# Patient Record
Sex: Female | Born: 1980 | Race: Black or African American | Hispanic: No | Marital: Single | Smoking: Never smoker
Health system: Southern US, Community
[De-identification: ages and names within clinical notes are randomized; demographics above are authoritative.]

## PROBLEM LIST (undated history)

## (undated) DIAGNOSIS — I10 Essential (primary) hypertension: Secondary | ICD-10-CM

## (undated) DIAGNOSIS — E785 Hyperlipidemia, unspecified: Secondary | ICD-10-CM

## (undated) HISTORY — DX: Essential (primary) hypertension: I10

## (undated) HISTORY — DX: Hyperlipidemia, unspecified: E78.5

---

## 1998-09-25 ENCOUNTER — Emergency Department (HOSPITAL_COMMUNITY): Admission: EM | Admit: 1998-09-25 | Discharge: 1998-09-25 | Payer: Self-pay | Admitting: Emergency Medicine

## 1999-12-29 ENCOUNTER — Emergency Department (HOSPITAL_COMMUNITY): Admission: EM | Admit: 1999-12-29 | Discharge: 1999-12-29 | Payer: Self-pay | Admitting: Emergency Medicine

## 2000-06-09 ENCOUNTER — Encounter: Payer: Self-pay | Admitting: Obstetrics and Gynecology

## 2000-06-09 ENCOUNTER — Ambulatory Visit (HOSPITAL_COMMUNITY): Admission: RE | Admit: 2000-06-09 | Discharge: 2000-06-09 | Payer: Self-pay | Admitting: Obstetrics and Gynecology

## 2000-08-15 ENCOUNTER — Inpatient Hospital Stay (HOSPITAL_COMMUNITY): Admission: AD | Admit: 2000-08-15 | Discharge: 2000-08-17 | Payer: Self-pay | Admitting: Obstetrics and Gynecology

## 2000-08-15 ENCOUNTER — Encounter (INDEPENDENT_AMBULATORY_CARE_PROVIDER_SITE_OTHER): Payer: Self-pay

## 2000-08-15 ENCOUNTER — Encounter (HOSPITAL_COMMUNITY): Admission: RE | Admit: 2000-08-15 | Discharge: 2000-08-16 | Payer: Self-pay | Admitting: Obstetrics and Gynecology

## 2001-01-11 ENCOUNTER — Emergency Department (HOSPITAL_COMMUNITY): Admission: EM | Admit: 2001-01-11 | Discharge: 2001-01-12 | Payer: Self-pay | Admitting: Emergency Medicine

## 2002-05-28 ENCOUNTER — Inpatient Hospital Stay (HOSPITAL_COMMUNITY): Admission: AD | Admit: 2002-05-28 | Discharge: 2002-05-30 | Payer: Self-pay | Admitting: Obstetrics and Gynecology

## 2002-05-28 ENCOUNTER — Encounter: Payer: Self-pay | Admitting: Obstetrics and Gynecology

## 2002-05-28 ENCOUNTER — Encounter (INDEPENDENT_AMBULATORY_CARE_PROVIDER_SITE_OTHER): Payer: Self-pay

## 2004-06-18 ENCOUNTER — Other Ambulatory Visit: Admission: RE | Admit: 2004-06-18 | Discharge: 2004-06-18 | Payer: Self-pay | Admitting: Obstetrics and Gynecology

## 2004-07-26 ENCOUNTER — Ambulatory Visit: Payer: Self-pay | Admitting: Obstetrics & Gynecology

## 2004-08-03 ENCOUNTER — Inpatient Hospital Stay (HOSPITAL_COMMUNITY): Admission: AD | Admit: 2004-08-03 | Discharge: 2004-08-03 | Payer: Self-pay | Admitting: Obstetrics and Gynecology

## 2004-08-03 ENCOUNTER — Ambulatory Visit: Payer: Self-pay | Admitting: *Deleted

## 2004-08-06 ENCOUNTER — Inpatient Hospital Stay (HOSPITAL_COMMUNITY): Admission: RE | Admit: 2004-08-06 | Discharge: 2004-08-11 | Payer: Self-pay | Admitting: Obstetrics and Gynecology

## 2004-08-07 ENCOUNTER — Encounter (INDEPENDENT_AMBULATORY_CARE_PROVIDER_SITE_OTHER): Payer: Self-pay | Admitting: Specialist

## 2007-04-15 ENCOUNTER — Emergency Department (HOSPITAL_COMMUNITY): Admission: EM | Admit: 2007-04-15 | Discharge: 2007-04-15 | Payer: Self-pay | Admitting: Emergency Medicine

## 2010-08-13 NOTE — Op Note (Signed)
Jamie Livingston, Jamie Livingston            ACCOUNT NO.:  [id number]   MEDICAL RECORD NO.:  [id number]          PATIENT TYPE:  INP   LOCATION:  9103                          F[facility]ILITY:  [facility]   PHYSICIAN:  Charles A. Delcambre, MDDATE OF BIRTH:  07/31/1980   DATE OF PROCEDURE:  08/07/2004  DATE OF DISCHARGE:                                 OPERATIVE REPORT   PREOPERATIVE DIAGNOSES:  1.  Intrauterine pregnancy at 37 weeks 2 days.  2.  Twin pregnancy.  3.  Intrauterine growth restriction, Baby B.  4.  Anti-E antibody.  5.  Transverse/transverse lie.  6.  Undesired fertility.  7.  Multiparity.   POSTOPERATIVE DIAGNOSES:  1.  Intrauterine pregnancy at 37 weeks 2 days.  2.  Twin pregnancy.  3.  Intrauterine growth restriction, Baby B.  4.  Anti-E antibody.  5.  Transverse/transverse lie.  6.  Undesired fertility.  7.  Multiparity.   PROCEDURES:  1.  Primary low transverse cesarean section.  2.  Bilateral tubal ligation, modified Pomeroy type.   SURGEON:  Dr. Sydnee Cabal   ASSISTANT:  Dr. Okey Dupre   ANESTHESIA:  Spinal.   SPECIMENS:  Will send to pathology.   OPERATIVE FINDINGS:  Baby A, vigorous female, cord arterial blood gas 7.27,  venous blood gas 7.36, Apgars 8 and 9, weight 5 pounds 9 ounces.  Baby B,  vigorous female, arterial 7.25, venous 7.31, Apgars 8 and 9, weight 4 pounds  10 ounces.   ESTIMATED BLOOD LOSS:  1250 mL.   COMPLICATIONS:  None.   URINE OUTPUT:  125 mL.   IV FLUIDS:  3000 lactated Ringer's.   INSTRUMENT, SPONGE AND NEEDLE COUNT:  Correct x 2.   DESCRIPTION OF PROCEDURE:  The patient was taken to the operating room and  placed in supine position and after spinal was dosed and was adequate, a  sterile prep and drape was undertaken.  Pfannenstiel incision was made with  a knife, carried down to fascia.  Fascia was incised with the knife and Mayo  scissors.  Rectus sheath was released superiorly and inferiorly.  Rectus  muscles were sharply dissected in the  midline; peritoneum was entered with  the Metzenbaum scissors.  Bladder blade was placed.  Lower uterine segment  was isolated, and the vesicouterine peritoneum was incised with the  Metzenbaum scissors.  Blunt dissection was used to develop the bladder flap,  and the bladder blade was placed.  Lower uterine segment transverse incision  to amniotomy was done.  Traction was used to extend the incision; hand was  inserted.  Back down transverse lie was noted.  Reaching in feet were  grasped carefully, and infant was manipulated down Baby A to a breech lie,  and feet were extracted. Breech extraction was then accomplished without  difficulty.  Arms were sweat-free and head delivered with mild fundal  pressure.  Cord was clamped, and infant was handed off to neonatology  personnel after being shown to the mother.  Cord was marked for Baby A with  a cord clamp.  Fundal pressure was then applied to move Baby B down, and  hand was  inserted to guide the baby down.  Attempt was made to grasp the  feet, in that the baby was noted to be back down transverse once again and  in trying to grasp the feet, the baby turned to a vertex lie with the feet  up in the fundus, precluding breech extraction.  Fundal pressure continued  in an effort to try to move the baby down in a vertex position.  Gentle but  delivered pressure was applied while guiding the head down with the internal  hand.  Continued fundal pressure was used but could not get the occiput to  enter completely into the uterine incision.  Judgment at this time after  several minutes or pressure was made to use the Mity-vac soft vacuum, and  this was applied in a single vacuum with no popoffs.  With one fundal  pressure application, very easily, the baby delivered within a few seconds  of application of the vacuum, a very easy delivery with guidance with the  vacuum.  Vacuum was released and the infant delivered without difficulty.  Cord was cut  and marked with two Kelly clamps for Baby B.  Infant was shown  to the family and taken to neonatologist in attendance.  Manual expression  of the placenta was then undertaken after cord gases were obtained and cord  blood was obtained.  Internal surface of the uterus was wiped with a  moistened lap.  Then #1 chromic running locking suture was then placed on  the uterus to close the incision.  Interrupted figure-of-eight suture of #1  chromic were used to close the second layer, imbricating over the first and  to achieve hemostasis.  Two 2-0 Vicryl figure-of-eight sutures were used to  achieve hemostasis, and hemostasis was excellent.  Irrigation was carried  out.  Bladder flap hemostasis and uterine hemostasis was excellent.  Attention was then turned to the tubal ligation.  Middle segment of the tube  on either side was doubly ligated with 0 plain gut.  Segment of tube was  excised.  Lumen were clearly transected and pedicles were with good  hemostasis.  Segments were sent separately left and right to pathology.  Hemostasis was verified after further irrigation of the pericolic gutters.  Subfascial hemostasis was excellent.  Fascia was closed with #1 Vicryl  running nonlocking suture.  Subcutaneous hemostasis was irrigated.  Minor  electrocautery was used to achieve hemostasis completely and skin staples  used to close the skin.  Sterile dressing was applied.  The patient was  taken to recovery with physician in attendance, having tolerated the  procedure well.      CAD/MEDQ  D:  08/07/2004  T:  08/08/2004  Job:  [id number]

## 2010-08-13 NOTE — Discharge Summary (Signed)
NAMESAXON, Jamie Livingston            ACCOUNT NO.:  [id number]   MEDICAL RECORD NO.:  [id number]          PATIENT TYPE:  WOC   LOCATION:  WOC                          FACILITY:  [facility]   PHYSICIAN:  Jamie Carne, Jamie Livingston  DATE OF BIRTH:  27-Apr-1980   DATE OF ADMISSION:  DATE OF DISCHARGE:                                 DISCHARGE SUMMARY   REASON FOR HOSPITALIZATION:  A [redacted] weeks gestation twin pregnancy with  intrauterine growth retardation of baby B and anti E affected pregnancy.   IN-HOSPITAL PROCEDURES:  Primary low transverse cesarean section for twin  gestation and Pomeroy bilateral tubal ligation performed by Leonette Most A.  Sydnee Cabal, M.D.   FINAL DIAGNOSIS:  Preterm viable delivery of twin gestation.   HOSPITAL COURSE:  This patient is a 29 year old African-American female, [facility]  September 15, 2004 at [redacted] weeks gestation with known anti E antibody, who  underwent a primary low transverse cesarean section on May 13,2006.  Baby B  demonstrated intrauterine growth retardation on ultrasound.  Both fetuses  were in a transverse lie prior to delivery.  Apgar and weights for both  fetuses per NICU records.   The patient's postoperative course was uneventful.  She was afebrile, voided  well.  Postoperative hemoglobin was 9.2 and hematocrit 26.7.  The patient's  incision was dry.  Abdomen soft.  Calves without tenderness.  Lungs were  clear.  Fundus was firm with minimal flow.   The patient was discharged with routine postpartum postoperative  instructions including contacting the office for temperature elevation above  100.4 degrees Fahrenheit, incisional drainage, erythema or increasing  abdominal or incisional pain.  Increasing vaginal bleeding, and/or urinary  tract symptomatology.  The patient was advised to return to Dr. Ashley Royalty  office on Aug 13, 2004 to have her staples removed.  Percocet 5/325 one to  two every 4-6 hours as needed was prescribed for the patient including  ibuprofen  800 milligrams up to three times a day.  The patient understood  all instructions and all questions answered to the satisfaction of said  patient.  Both babies are in the neonatal intensive care unit the time of  this dictation.      SHB/MEDQ  D:  08/11/2004  T:  08/11/2004  Job:  [id number]

## 2010-08-13 NOTE — Discharge Summary (Signed)
[facility]  Patient:    Jamie Livingston, Jamie Livingston                     MRN: [id number] Adm. Date:  08/15/00 Disc. Date: 08/17/00 Attending:  Malachi Pro. Ambrose Mantle, M.D.                           Discharge Summary                                This is a 30 year old black single female para 1-0-1-1, gravida 3 with last period questionably in September 2001, [facility] Aug 13, 2000 by ultrasound done June 09, 2000 admitted for induction after having variable decelerations on a nonstress test and a positive OCT.  Blood group and type A+.  Negative antibody.  Sickle cell negative.  VDRL negative. Rubella immune.  Hepatitis B surface antigen negative.  HIV negative.  GC and chlamydia negative.  One hour glucola 91.  Group B strep negative.  The patients first prenatal visit June 16, 2000:  Ultrasound June 09, 2000 showed an average gestational age of [redacted] weeks and 5 days with an [facility] of Aug 13, 2000.  She was treated with Macrobid for a UTI.  The patient had been scheduled for a nonstress test on Aug 11, 2000, but failed to keep the appointment.  She did come for a nonstress test on Aug 15, 2000 that showed two spontaneous variable decelerations and OCT showed two consecutive late decelerations so she was admitted for induction.  ALLERGIES:                    BENADRYL causes hives.  PAST SURGICAL HISTORY:        None.  PAST MEDICAL HISTORY:         Usual childhood diseases.  SOCIAL HISTORY:               Alcohol, tobacco, and drugs:  None.  FAMILY HISTORY:               No significant family history in first degree relatives.  PAST OBSTETRICAL HISTORY:     In February 1999 the patient delivered a 7 pounds 6 ounces female at 38 weeks.  It was a vaginal delivery without complications.  PHYSICAL EXAMINATION  VITAL SIGNS:                  Normal.  ABDOMEN:                      Soft and nontender.  Fundal height had been 39 cm on Aug 14, 2000.  Fetal heart tones  showed recurrent variable decelerations with good recovery and good variability.  PELVIC:                       Cervix was 2 cm long, vertex, -3 by the maternity admission unit nurse.  IMPRESSION:                   Intrauterine pregnancy at 40 weeks, positive OCT.  Patient was admitted for Pitocin.  She did respond to Pitocin and progressed to 4 cm.  She received an epidural.  She had spontaneous rupture of membranes with meconium stained fluid.  She then progressed to full dilatation and delivered spontaneously LOA over an intact perineum  by Dr. Ambrose Mantle a living female infant 6 pounds 5 ounces, Apgars of 8 at one and 9 at five minutes. Because of meconium stained fluid, the neonatal team was in attendance with Dr. Mikle Bosworth present.  There was meconium stained fluid and the DeLee and the bulb were used on the perineum.  Dr. Mikle Bosworth attended the baby and assigned the Apgars.  Placenta was intact.  The uterus was normal.  A 4 mm yellow cyst was removed from the left lateral vagina near the introitus and was sutured with 3-0 Dexon.  Blood loss was about 400 cc.  Postpartum the patient did quite well and was discharged on the second postpartum day.  Her initial hemoglobin was 12.7, hematocrit 37.7, white count 11,200, platelet count 235,000. Followup hematocrit was 35.7.  RPR was nonreactive.  FINAL DIAGNOSES:              Intrauterine pregnancy at term delivered LOA. Variable decelerations on nonstress test.  Positive OCT.  Left vaginal cyst.  OPERATION:                    Spontaneous delivery LOA, removal of vaginal cyst, Pitocin induction of labor.  FINAL CONDITION:              Improved.  INSTRUCTIONS:                 Regular discharge instruction booklet.  Patient did get a prescription for ibuprofen 600 mg 20 tablets one q.6h. p.r.n. pain. She is advised to return to the office in six weeks for followup examination. DD:  08/17/00 TD:  08/17/00 Job: [id number]

## 2018-08-31 ENCOUNTER — Ambulatory Visit (HOSPITAL_COMMUNITY)
Admission: EM | Admit: 2018-08-31 | Discharge: 2018-08-31 | Disposition: A | Discharge status: Home / Self Care | Payer: PRIVATE HEALTH INSURANCE | Attending: Family Medicine | Admitting: Family Medicine

## 2018-08-31 ENCOUNTER — Encounter (HOSPITAL_COMMUNITY): Payer: Self-pay | Admitting: Emergency Medicine

## 2018-08-31 ENCOUNTER — Other Ambulatory Visit: Payer: Self-pay

## 2018-08-31 DIAGNOSIS — R030 Elevated blood-pressure reading, without diagnosis of hypertension: Secondary | ICD-10-CM

## 2018-08-31 DIAGNOSIS — H6981 Other specified disorders of Eustachian tube, right ear: Secondary | ICD-10-CM

## 2018-08-31 DIAGNOSIS — H6991 Unspecified Eustachian tube disorder, right ear: Secondary | ICD-10-CM

## 2018-08-31 MED ORDER — LORATADINE 10 MG PO TABS: 10.0000 mg | ORAL_TABLET | Freq: Every day | ORAL | 0 refills | Status: AC

## 2018-08-31 MED ORDER — FLUTICASONE PROPIONATE 50 MCG/ACT NA SUSP: 2.0000 | Freq: Every day | NASAL | 2 refills | Status: AC

## 2018-08-31 NOTE — Discharge Instructions (Addendum)
Use nasal spray and allergy medications as directed. Drink plenty of water throughout day to stay hydrated and keep secretion thin. Return if you develop fever, worsening ear/eye/face pain, cough.

## 2018-08-31 NOTE — ED Provider Notes (Signed)
[facility]    CSN: [id number] Arrival date & time: 08/31/18  1553     History   Chief Complaint Chief Complaint  Patient presents with  . Otalgia    HPI Jamie Livingston is a 38 y.o. female with history of morbid obesity presenting for a right ear pain since last week.  Patient endorses fullness, popping, pressure.  Patient denies fever, upper respiratory symptoms, discharge, hearing loss, facial pain.  She has tried over-the-counter "earache drops "with minimal relief of symptoms.  Patient reports history of seasonal allergies, not currently taking any medications.   History reviewed. No pertinent past medical history.  There are no active problems to display for this patient.   History reviewed. No pertinent surgical history.  OB History   No obstetric history on file.      Home Medications    Prior to Admission medications   Medication Sig Start Date End Date Taking? Authorizing Provider  fluticasone (FLONASE) 50 MCG/ACT nasal spray Place 2 sprays into both nostrils daily. 08/31/18   Hall-Potvin, [place], PA-C  loratadine (CLARITIN) 10 MG tablet Take 1 tablet (10 mg total) by mouth daily. 08/31/18   Hall-Potvin, [place], PA-C    Family History History reviewed. No pertinent family history.  Social History Social History   Tobacco Use  . Smoking status: Never Smoker  . Smokeless tobacco: Never Used  Substance Use Topics  . Alcohol use: Never    Frequency: Never  . Drug use: Never     Allergies   Patient has no allergy information on record.   Review of Systems As per HPI   Physical Exam Triage Vital Signs ED Triage Vitals  Enc Vitals Group     BP 08/31/18 1616 (!) 159/100     Pulse Rate 08/31/18 1616 100     Resp 08/31/18 1616 18     Temp 08/31/18 1620 99.1 F (37.3 C)     Temp Source 08/31/18 1616 Oral     SpO2 08/31/18 1616 96 %     Weight --      Height --      Head Circumference --      Peak Flow --      Pain Score  08/31/18 1617 7     Pain Loc --      Pain Edu? --      Excl. in GC? --    No data found.  Updated Vital Signs BP (!) 159/100 (BP Location: Right Arm)   Pulse 100   Temp 99.1 F (37.3 C) (Oral)   Resp 18   SpO2 96%   Visual Acuity Right Eye Distance:   Left Eye Distance:   Bilateral Distance:    Right Eye Near:   Left Eye Near:    Bilateral Near:     Physical Exam Constitutional:      General: She is not in acute distress. HENT:     Head: Normocephalic and atraumatic.     Jaw: There is normal jaw occlusion. No tenderness or pain on movement.     Right Ear: Tympanic membrane, ear canal and external ear normal. No tenderness. There is no impacted cerumen.     Left Ear: Tympanic membrane, ear canal and external ear normal. No tenderness. There is no impacted cerumen.     Ears:     Comments: Clear fluid behind TM with few bubbles.  No blood or purulent discharge behind TM which is intact    Nose: No nasal  deformity, septal deviation, nasal tenderness or rhinorrhea.     Right Sinus: No maxillary sinus tenderness or frontal sinus tenderness.     Left Sinus: No maxillary sinus tenderness or frontal sinus tenderness.     Comments: Bilateral turbinate swelling (right greater than left) mucosal lining is pink    Mouth/Throat:     Lips: Pink. No lesions.     Mouth: Mucous membranes are moist.     Pharynx: Oropharynx is clear. Uvula midline. No oropharyngeal exudate, posterior oropharyngeal erythema or uvula swelling.     Tonsils: No tonsillar exudate. 2+ on the right. 2+ on the left.  Eyes:     General: No scleral icterus.       Right eye: No discharge.        Left eye: No discharge.     Extraocular Movements: Extraocular movements intact.     Conjunctiva/sclera: Conjunctivae normal.     Pupils: Pupils are equal, round, and reactive to light.  Neck:     Musculoskeletal: Normal range of motion and neck supple. No muscular tenderness.  Cardiovascular:     Rate and Rhythm:  Normal rate.  Pulmonary:     Effort: Pulmonary effort is normal.  Lymphadenopathy:     Cervical: No cervical adenopathy.  Neurological:     Mental Status: She is alert and oriented to person, place, and time.      UC Treatments / Results  Labs (all labs ordered are listed, but only abnormal results are displayed) Labs Reviewed - No data to display  EKG None  Radiology No results found.  Procedures Procedures (including critical care time)  Medications Ordered in UC Medications - No data to display  Initial Impression / Assessment and Plan / UC Course  I have reviewed the triage vital signs and the nursing notes.  Pertinent labs & imaging results that were available during my care of the patient were reviewed by me and considered in my medical decision making (see chart for details).     38 year old female history of morbid obesity presenting for right otalgia x1 week.  Exam reassuring, likely eustachian tube dysfunction second to turbinate swelling.  Will treat seasonal allergies, intranasal steroid and monitor as there are no signs of acute infection at this time.  Return precautions discussed, patient verbalized understanding. Final Clinical Impressions(s) / UC Diagnoses   Final diagnoses:  Elevated blood pressure reading in office without diagnosis of hypertension  Acute dysfunction of Eustachian tube, right     Discharge Instructions     Use nasal spray and allergy medications as directed. Drink plenty of water throughout day to stay hydrated and keep secretion thin. Return if you develop fever, worsening ear/eye/face pain, cough.    ED Prescriptions    Medication Sig Dispense Auth. Provider   fluticasone (FLONASE) 50 MCG/ACT nasal spray Place 2 sprays into both nostrils daily. 16 g Hall-Potvin, [place], PA-C   loratadine (CLARITIN) 10 MG tablet Take 1 tablet (10 mg total) by mouth daily. 30 tablet Hall-Potvin, [place], PA-C     Controlled Substance  Prescriptions [place] Controlled Substance Registry consulted? Not Applicable   Shea EvansHall-Potvin, Brittany, [place] JerseyPA-C 08/31/18 1646

## 2019-11-13 ENCOUNTER — Other Ambulatory Visit: Payer: Self-pay | Admitting: Critical Care Medicine

## 2019-11-13 ENCOUNTER — Other Ambulatory Visit: Payer: Self-pay

## 2019-11-13 DIAGNOSIS — Z20822 Contact with and (suspected) exposure to covid-19: Secondary | ICD-10-CM

## 2019-11-15 LAB — NOVEL CORONAVIRUS, NAA: SARS-CoV-2, NAA: NOT DETECTED

## 2019-11-15 LAB — SARS-COV-2, NAA 2 DAY TAT

## 2019-11-20 ENCOUNTER — Other Ambulatory Visit: Payer: PRIVATE HEALTH INSURANCE

## 2020-08-31 ENCOUNTER — Other Ambulatory Visit: Payer: Self-pay

## 2020-08-31 ENCOUNTER — Encounter (HOSPITAL_BASED_OUTPATIENT_CLINIC_OR_DEPARTMENT_OTHER): Payer: Self-pay | Admitting: Emergency Medicine

## 2020-08-31 ENCOUNTER — Emergency Department (HOSPITAL_BASED_OUTPATIENT_CLINIC_OR_DEPARTMENT_OTHER): Payer: BC Managed Care – PPO

## 2020-08-31 ENCOUNTER — Emergency Department (HOSPITAL_BASED_OUTPATIENT_CLINIC_OR_DEPARTMENT_OTHER)
Admission: EM | Admit: 2020-08-31 | Discharge: 2020-08-31 | Disposition: A | Discharge status: Home / Self Care | Payer: BC Managed Care – PPO | Attending: Emergency Medicine | Admitting: Emergency Medicine

## 2020-08-31 ENCOUNTER — Emergency Department (HOSPITAL_BASED_OUTPATIENT_CLINIC_OR_DEPARTMENT_OTHER): Payer: BC Managed Care – PPO | Admitting: Radiology

## 2020-08-31 DIAGNOSIS — W01198A Fall on same level from slipping, tripping and stumbling with subsequent striking against other object, initial encounter: Secondary | ICD-10-CM | POA: Insufficient documentation

## 2020-08-31 DIAGNOSIS — R42 Dizziness and giddiness: Secondary | ICD-10-CM | POA: Diagnosis not present

## 2020-08-31 DIAGNOSIS — R55 Syncope and collapse: Secondary | ICD-10-CM | POA: Insufficient documentation

## 2020-08-31 DIAGNOSIS — I160 Hypertensive urgency: Secondary | ICD-10-CM | POA: Diagnosis not present

## 2020-08-31 DIAGNOSIS — I10 Essential (primary) hypertension: Secondary | ICD-10-CM | POA: Diagnosis not present

## 2020-08-31 DIAGNOSIS — Z043 Encounter for examination and observation following other accident: Secondary | ICD-10-CM | POA: Diagnosis not present

## 2020-08-31 DIAGNOSIS — S069X9A Unspecified intracranial injury with loss of consciousness of unspecified duration, initial encounter: Secondary | ICD-10-CM | POA: Diagnosis not present

## 2020-08-31 LAB — CBC
HCT: 31 % — ABNORMAL LOW (ref 36.0–46.0)
Hemoglobin: 9.2 g/dL — ABNORMAL LOW (ref 12.0–15.0)
MCH: 17.9 pg — ABNORMAL LOW (ref 26.0–34.0)
MCHC: 29.7 g/dL — ABNORMAL LOW (ref 30.0–36.0)
MCV: 60.3 fL — ABNORMAL LOW (ref 80.0–100.0)
Platelets: 429 10*3/uL — ABNORMAL HIGH (ref 150–400)
RBC: 5.14 MIL/uL — ABNORMAL HIGH (ref 3.87–5.11)
RDW: 18.1 % — ABNORMAL HIGH (ref 11.5–15.5)
WBC: 8 10*3/uL (ref 4.0–10.5)
nRBC: 0 % (ref 0.0–0.2)

## 2020-08-31 LAB — URINALYSIS, ROUTINE W REFLEX MICROSCOPIC
Bilirubin Urine: NEGATIVE
Glucose, UA: NEGATIVE mg/dL
Hgb urine dipstick: NEGATIVE
Nitrite: NEGATIVE
Protein, ur: 30 mg/dL — AB
Specific Gravity, Urine: 1.019 (ref 1.005–1.030)
pH: 6 (ref 5.0–8.0)

## 2020-08-31 LAB — COMPREHENSIVE METABOLIC PANEL
ALT: 10 U/L (ref 0–44)
AST: 20 U/L (ref 15–41)
Albumin: 4.1 g/dL (ref 3.5–5.0)
Alkaline Phosphatase: 40 U/L (ref 38–126)
Anion gap: 11 (ref 5–15)
BUN: 10 mg/dL (ref 6–20)
CO2: 23 mmol/L (ref 22–32)
Calcium: 8.9 mg/dL (ref 8.9–10.3)
Chloride: 103 mmol/L (ref 98–111)
Creatinine, Ser: 0.93 mg/dL (ref 0.44–1.00)
GFR, Estimated: >60 mL/min (ref >60)
Glucose, Bld: 86 mg/dL (ref 70–99)
Potassium: 3.4 mmol/L — ABNORMAL LOW (ref 3.5–5.1)
Sodium: 137 mmol/L (ref 135–145)
Total Bilirubin: 0.4 mg/dL (ref 0.3–1.2)
Total Protein: 7.8 g/dL (ref 6.5–8.1)

## 2020-08-31 LAB — PREGNANCY, URINE: Preg Test, Ur: NEGATIVE

## 2020-08-31 LAB — CBG MONITORING, ED: Glucose-Capillary: 95 mg/dL (ref 70–99)

## 2020-08-31 MED ORDER — CEPHALEXIN 500 MG PO CAPS: 500.0000 mg | ORAL_CAPSULE | Freq: Three times a day (TID) | ORAL | 0 refills | Status: AC

## 2020-08-31 NOTE — ED Triage Notes (Signed)
Reports fall yesterday, with syncopal episode. States she hit her head on the ground. Hypertension noted in triage.

## 2020-08-31 NOTE — ED Provider Notes (Signed)
[facility] EMERGENCY DEPT Provider Note   CSN: [id number] Arrival date & time: 08/31/20  1829     History Chief Complaint  Patient presents with  . Fall    Jamie Livingston is a 40 y.o. female.  Presents to the emergency room with syncope.  Patient reports yesterday she had a syncopal episode.  Prior to she had felt lightheaded and felt like she was going to pass out and then passed out and landed on her face.  Denied any prolonged confusion.  No bladder or bowel incontinence, no tongue or lip biting.  No associated chest pain or difficulty breathing.  She feels fine otherwise.  Denies any neck or back pain.  Denies any other trauma, has been ambulatory without difficulty.  HPI     History reviewed. No pertinent past medical history.  There are no problems to display for this patient.   History reviewed. No pertinent surgical history.   OB History   No obstetric history on file.     History reviewed. No pertinent family history.  Social History   Tobacco Use  . Smoking status: Never Smoker  . Smokeless tobacco: Never Used  Substance Use Topics  . Alcohol use: Never  . Drug use: Never    Home Medications Prior to Admission medications   Medication Sig Start Date End Date Taking? Authorizing Provider  cephALEXin (KEFLEX) 500 MG capsule Take 1 capsule (500 mg total) by mouth 3 (three) times daily for 7 days. 08/31/20 09/07/20 Yes Jen Benedict, Quitman Livings, MD  fluticasone (FLONASE) 50 MCG/ACT nasal spray Place 2 sprays into both nostrils daily. 08/31/18   Hall-Potvin, [place], PA-C  loratadine (CLARITIN) 10 MG tablet Take 1 tablet (10 mg total) by mouth daily. 08/31/18   Hall-Potvin, [place], PA-C    Allergies    Patient has no known allergies.  Review of Systems   Review of Systems  Constitutional: Negative for chills and fever.  HENT: Negative for ear pain and sore throat.   Eyes: Negative for pain and visual disturbance.  Respiratory: Negative for cough  and shortness of breath.   Cardiovascular: Negative for chest pain and palpitations.  Gastrointestinal: Negative for abdominal pain and vomiting.  Genitourinary: Negative for dysuria and hematuria.  Musculoskeletal: Negative for arthralgias and back pain.  Skin: Negative for color change and rash.  Neurological: Positive for syncope. Negative for seizures.  All other systems reviewed and are negative.   Physical Exam Updated Vital Signs BP (!) 172/94 (BP Location: Right Arm)   Pulse 93   Temp 98.1 F (36.7 C) (Oral)   Resp 18   Ht 5\' 1"  (1.549 m)   Wt (!) 182.3 kg   SpO2 96%   BMI 75.96 kg/m   Physical Exam Vitals and nursing note reviewed.  Constitutional:      General: She is not in acute distress.    Appearance: She is well-developed.  HENT:     Head: Normocephalic.     Comments: Superficial abrasion to forehead Eyes:     Conjunctiva/sclera: Conjunctivae normal.  Neck:     Comments: No midline C-spine tenderness Cardiovascular:     Rate and Rhythm: Normal rate and regular rhythm.     Heart sounds: No murmur heard.   Pulmonary:     Effort: Pulmonary effort is normal. No respiratory distress.     Breath sounds: Normal breath sounds.  Abdominal:     Palpations: Abdomen is soft.     Tenderness: There is no abdominal tenderness.  Musculoskeletal:        General: No deformity or signs of injury.     Cervical back: Neck supple.     Comments: Back: no C, T, L spine TTP, no step off or deformity RUE: no TTP throughout, no deformity, normal joint ROM, radial pulse intact, distal sensation and motor intact LUE: no TTP throughout, no deformity, normal joint ROM, radial pulse intact, distal sensation and motor intact RLE:  no TTP throughout, no deformity, normal joint ROM, distal pulse, sensation and motor intact LLE: no TTP throughout, no deformity, normal joint ROM, distal pulse, sensation and motor intact  Skin:    General: Skin is warm and dry.  Neurological:      Mental Status: She is alert.     ED Results / Procedures / Treatments   Labs (all labs ordered are listed, but only abnormal results are displayed) Labs Reviewed  CBC - Abnormal; Notable for the following components:      Result Value   RBC 5.14 (*)    Hemoglobin 9.2 (*)    HCT 31.0 (*)    MCV 60.3 (*)    MCH 17.9 (*)    MCHC 29.7 (*)    RDW 18.1 (*)    Platelets 429 (*)    All other components within normal limits  URINALYSIS, ROUTINE W REFLEX MICROSCOPIC - Abnormal; Notable for the following components:   APPearance HAZY (*)    Ketones, ur TRACE (*)    Protein, ur 30 (*)    Leukocytes,Ua MODERATE (*)    Bacteria, UA MANY (*)    All other components within normal limits  COMPREHENSIVE METABOLIC PANEL - Abnormal; Notable for the following components:   Potassium 3.4 (*)    All other components within normal limits  PREGNANCY, URINE  CBG MONITORING, ED    EKG EKG Interpretation  Date/Time:  Monday August 31 2020 19:13:50 EDT Ventricular Rate:  101 PR Interval:  154 QRS Duration: 90 QT Interval:  372 QTC Calculation: 482 R Axis:   -5 Text Interpretation: Sinus tachycardia Left ventricular hypertrophy with repolarization abnormality ( R in aVL , Cornell product ) Cannot rule out Septal infarct , age undetermined Confirmed by Nicanor AlconPalumbo, April ([id number]) on 08/31/2020 11:25:16 PM   Radiology DG Chest 2 View  Result Date: 08/31/2020 CLINICAL DATA:  Fall EXAM: CHEST - 2 VIEW COMPARISON:  None. FINDINGS: Lungs are clear.  No pleural effusion or pneumothorax. Cardiomegaly. Mild degenerative changes of the visualized thoracolumbar spine. IMPRESSION: Cardiomegaly. No evidence of acute cardiopulmonary disease. Electronically Signed   By: Charline BillsSriyesh  Krishnan M.D.   On: 08/31/2020 20:52   CT Head Wo Contrast  Result Date: 08/31/2020 CLINICAL DATA:  Fall, syncope EXAM: CT HEAD WITHOUT CONTRAST CT MAXILLOFACIAL WITHOUT CONTRAST TECHNIQUE: Multidetector CT imaging of the head and maxillofacial  structures were performed using the standard protocol without intravenous contrast. Multiplanar CT image reconstructions of the maxillofacial structures were also generated. COMPARISON:  None. FINDINGS: CT HEAD FINDINGS Brain: No evidence of acute infarction, hemorrhage, hydrocephalus, extra-axial collection or mass lesion/mass effect. Vascular: No hyperdense vessel or unexpected calcification. Skull: Normal. Negative for fracture or focal lesion. Other: None. CT MAXILLOFACIAL FINDINGS Osseous: No evidence of maxillofacial fracture. Mandible is intact. Mild physiologic laxity of the bilateral mandibular condyles, which are slightly anteriorly positioned within the TMJs, within the range of normal variation given the open mouth position. Orbits: Bilateral orbits, including the globes and retroconal soft tissues, are within normal limits. Mild soft tissue swelling overlying  the right lateral orbit/zygoma (series 2/image 69). Sinuses: The visualized paranasal sinuses are essentially clear. The mastoid air cells are unopacified. Soft tissues: Right facial soft tissue swelling, as above. IMPRESSION: Normal head CT. Mild soft tissue swelling along the right lateral orbit/zygoma. No evidence of maxillofacial fracture. Electronically Signed   By: Charline Bills M.D.   On: 08/31/2020 20:59   CT Maxillofacial Wo Contrast  Result Date: 08/31/2020 CLINICAL DATA:  Fall, syncope EXAM: CT HEAD WITHOUT CONTRAST CT MAXILLOFACIAL WITHOUT CONTRAST TECHNIQUE: Multidetector CT imaging of the head and maxillofacial structures were performed using the standard protocol without intravenous contrast. Multiplanar CT image reconstructions of the maxillofacial structures were also generated. COMPARISON:  None. FINDINGS: CT HEAD FINDINGS Brain: No evidence of acute infarction, hemorrhage, hydrocephalus, extra-axial collection or mass lesion/mass effect. Vascular: No hyperdense vessel or unexpected calcification. Skull: Normal. Negative  for fracture or focal lesion. Other: None. CT MAXILLOFACIAL FINDINGS Osseous: No evidence of maxillofacial fracture. Mandible is intact. Mild physiologic laxity of the bilateral mandibular condyles, which are slightly anteriorly positioned within the TMJs, within the range of normal variation given the open mouth position. Orbits: Bilateral orbits, including the globes and retroconal soft tissues, are within normal limits. Mild soft tissue swelling overlying the right lateral orbit/zygoma (series 2/image 69). Sinuses: The visualized paranasal sinuses are essentially clear. The mastoid air cells are unopacified. Soft tissues: Right facial soft tissue swelling, as above. IMPRESSION: Normal head CT. Mild soft tissue swelling along the right lateral orbit/zygoma. No evidence of maxillofacial fracture. Electronically Signed   By: Charline Bills M.D.   On: 08/31/2020 20:59    Procedures Procedures   Medications Ordered in ED Medications - No data to display  ED Course  I have reviewed the triage vital signs and the nursing notes.  Pertinent labs & imaging results that were available during my care of the patient were reviewed by me and considered in my medical decision making (see chart for details).    MDM Rules/Calculators/A&P                         40 year old lady presents to ER with concern for syncope, head trauma.  On exam patient well-appearing in no distress.  Noted to have hypertension but otherwise normal vitals.  CT imaging was negative for acute pathology.  Basic labs are stable.  Mild hypokalemia.  UA with likely UTI.  EKG with normal intervals, no evidence of arrhythmia on telemetry monitoring.  Based on description of syncopal episode, suspect most likely vasovagal in nature.  Given there is no associated headache, chest pain, back pain, suspect her hypertension is chronic and more incidental in nature.  Patient states that she has not seen a doctor regularly for quite some time.  At  present recommend she have close follow-up with a primary care doctor to discuss ongoing management of blood pressure and to discuss the syncopal episode. Will give rx for posssible UTI.   After the discussed management above, the patient was determined to be safe for discharge.  The patient was in agreement with this plan and all questions regarding their care were answered.  ED return precautions were discussed and the patient will return to the ED with any significant worsening of condition.   Final Clinical Impression(s) / ED Diagnoses Final diagnoses:  Hypertension, unspecified type  Syncope, unspecified syncope type    Rx / DC Orders ED Discharge Orders         Ordered  cephALEXin (KEFLEX) 500 MG capsule  3 times daily        08/31/20 2331           Milagros Loll, MD 09/01/20 1539

## 2020-08-31 NOTE — Discharge Instructions (Addendum)
Follow-up with your primary care doctor, take antibiotic as prescribed.  Discussed your blood pressure management with your primary care doctor.  Return to ER if you have any episodes of passing out again or other new concerning symptom.

## 2020-09-08 DIAGNOSIS — I10 Essential (primary) hypertension: Secondary | ICD-10-CM | POA: Diagnosis not present

## 2020-10-09 DIAGNOSIS — N920 Excessive and frequent menstruation with regular cycle: Secondary | ICD-10-CM | POA: Diagnosis not present

## 2020-10-09 DIAGNOSIS — D509 Iron deficiency anemia, unspecified: Secondary | ICD-10-CM | POA: Diagnosis not present

## 2020-10-09 DIAGNOSIS — I10 Essential (primary) hypertension: Secondary | ICD-10-CM | POA: Diagnosis not present

## 2020-10-12 ENCOUNTER — Other Ambulatory Visit: Payer: Self-pay | Admitting: Family Medicine

## 2020-10-12 DIAGNOSIS — N920 Excessive and frequent menstruation with regular cycle: Secondary | ICD-10-CM

## 2020-10-22 ENCOUNTER — Ambulatory Visit
Admission: RE | Admit: 2020-10-22 | Discharge: 2020-10-22 | Disposition: A | Discharge status: Home / Self Care | Payer: BC Managed Care – PPO | Source: Ambulatory Visit | Attending: Family Medicine | Admitting: Family Medicine

## 2020-10-22 DIAGNOSIS — D251 Intramural leiomyoma of uterus: Secondary | ICD-10-CM | POA: Diagnosis not present

## 2020-10-22 DIAGNOSIS — N920 Excessive and frequent menstruation with regular cycle: Secondary | ICD-10-CM

## 2020-10-22 DIAGNOSIS — D252 Subserosal leiomyoma of uterus: Secondary | ICD-10-CM | POA: Diagnosis not present

## 2020-10-22 DIAGNOSIS — D250 Submucous leiomyoma of uterus: Secondary | ICD-10-CM | POA: Diagnosis not present

## 2020-10-28 DIAGNOSIS — D509 Iron deficiency anemia, unspecified: Secondary | ICD-10-CM | POA: Diagnosis not present

## 2020-10-28 DIAGNOSIS — I10 Essential (primary) hypertension: Secondary | ICD-10-CM | POA: Diagnosis not present

## 2020-11-25 DIAGNOSIS — I10 Essential (primary) hypertension: Secondary | ICD-10-CM | POA: Diagnosis not present

## 2021-06-01 ENCOUNTER — Other Ambulatory Visit: Payer: Self-pay | Admitting: Family Medicine

## 2021-06-01 DIAGNOSIS — Z1231 Encounter for screening mammogram for malignant neoplasm of breast: Secondary | ICD-10-CM

## 2021-07-02 ENCOUNTER — Ambulatory Visit: Payer: BC Managed Care – PPO

## 2021-07-07 ENCOUNTER — Other Ambulatory Visit: Payer: Self-pay | Admitting: Nurse Practitioner

## 2021-07-07 ENCOUNTER — Other Ambulatory Visit (HOSPITAL_COMMUNITY)
Admission: RE | Admit: 2021-07-07 | Discharge: 2021-07-07 | Disposition: A | Discharge status: Home / Self Care | Payer: No Typology Code available for payment source | Source: Ambulatory Visit | Attending: Nurse Practitioner | Admitting: Nurse Practitioner

## 2021-07-07 DIAGNOSIS — Z124 Encounter for screening for malignant neoplasm of cervix: Secondary | ICD-10-CM | POA: Insufficient documentation

## 2021-07-09 ENCOUNTER — Ambulatory Visit
Admission: RE | Admit: 2021-07-09 | Discharge: 2021-07-09 | Disposition: A | Discharge status: Home / Self Care | Payer: No Typology Code available for payment source | Source: Ambulatory Visit | Attending: Family Medicine | Admitting: Family Medicine

## 2021-07-09 ENCOUNTER — Ambulatory Visit: Payer: BC Managed Care – PPO

## 2021-07-09 DIAGNOSIS — Z1231 Encounter for screening mammogram for malignant neoplasm of breast: Secondary | ICD-10-CM

## 2021-07-12 LAB — CYTOLOGY - PAP
Adequacy: ABSENT
Comment: NEGATIVE
Diagnosis: NEGATIVE
High risk HPV: NEGATIVE

## 2021-12-31 ENCOUNTER — Emergency Department (HOSPITAL_COMMUNITY): Payer: No Typology Code available for payment source

## 2021-12-31 ENCOUNTER — Other Ambulatory Visit: Payer: Self-pay

## 2021-12-31 ENCOUNTER — Encounter (HOSPITAL_COMMUNITY): Payer: Self-pay | Admitting: Emergency Medicine

## 2021-12-31 ENCOUNTER — Emergency Department (HOSPITAL_COMMUNITY)
Admission: EM | Admit: 2021-12-31 | Discharge: 2022-01-01 | Disposition: A | Discharge status: Home / Self Care | Payer: No Typology Code available for payment source | Attending: Emergency Medicine | Admitting: Emergency Medicine

## 2021-12-31 DIAGNOSIS — R002 Palpitations: Secondary | ICD-10-CM | POA: Insufficient documentation

## 2021-12-31 DIAGNOSIS — Z1152 Encounter for screening for COVID-19: Secondary | ICD-10-CM | POA: Diagnosis not present

## 2021-12-31 DIAGNOSIS — R202 Paresthesia of skin: Secondary | ICD-10-CM | POA: Diagnosis not present

## 2021-12-31 LAB — BASIC METABOLIC PANEL
Anion gap: 11 (ref 5–15)
BUN: 11 mg/dL (ref 6–20)
CO2: 25 mmol/L (ref 22–32)
Calcium: 9.4 mg/dL (ref 8.9–10.3)
Chloride: 103 mmol/L (ref 98–111)
Creatinine, Ser: 0.89 mg/dL (ref 0.44–1.00)
GFR, Estimated: >60 mL/min (ref >60)
Glucose, Bld: 111 mg/dL — ABNORMAL HIGH (ref 70–99)
Potassium: 3.3 mmol/L — ABNORMAL LOW (ref 3.5–5.1)
Sodium: 139 mmol/L (ref 135–145)

## 2021-12-31 LAB — CBC
HCT: 36.7 % (ref 36.0–46.0)
Hemoglobin: 13 g/dL (ref 12.0–15.0)
MCH: 27.8 pg (ref 26.0–34.0)
MCHC: 35.4 g/dL (ref 30.0–36.0)
MCV: 78.4 fL — ABNORMAL LOW (ref 80.0–100.0)
Platelets: 403 10*3/uL — ABNORMAL HIGH (ref 150–400)
RBC: 4.68 MIL/uL (ref 3.87–5.11)
RDW: 14.2 % (ref 11.5–15.5)
WBC: 10 10*3/uL (ref 4.0–10.5)
nRBC: 0 % (ref 0.0–0.2)

## 2021-12-31 LAB — TROPONIN I (HIGH SENSITIVITY): Troponin I (High Sensitivity): 5 ng/L (ref <18)

## 2021-12-31 LAB — TSH: TSH: 4.539 u[IU]/mL — ABNORMAL HIGH (ref 0.350–4.500)

## 2021-12-31 LAB — I-STAT BETA HCG BLOOD, ED (MC, WL, AP ONLY): I-stat hCG, quantitative: <5 m[IU]/mL (ref <5)

## 2021-12-31 LAB — D-DIMER, QUANTITATIVE: D-Dimer, Quant: 0.29 ug/mL-FEU (ref 0.00–0.50)

## 2021-12-31 NOTE — ED Triage Notes (Signed)
Pt reported to ED with c/o palpitations and left sided chest pain since earlier in the evening. States she was not engaging in any activity when symptoms began.

## 2021-12-31 NOTE — ED Provider Triage Note (Signed)
Emergency Medicine Provider Triage Evaluation Note  Jamie Livingston , a 41 y.o. female  was evaluated in triage.  Pt complains of tingling in the arm, palpitations in the chest.  Patient states she initially had some generalized malaise or this week.  She did have a slight cough.  Symptoms seem like they were getting better but then today she started having discomfort in her left arm and tingling.  She also felt like her heart was racing.  She denies any history of PE or DVT.  No history of heart disease.  Patient does have history of hypertension.  Review of Systems  Positive:  Negative:   Physical Exam  BP (!) 182/95   Pulse (!) 113   Temp 99.4 F (37.4 C)   Resp 18   SpO2 99%  Gen:   Awake, no distress, elevated BMI Resp:  Normal effort  MSK:   Moves extremities without difficulty  Other:    Medical Decision Making  Medically screening exam initiated at 9:33 PM.  Appropriate orders placed.  Jamie Livingston was informed that the remainder of the evaluation will be completed by another provider, this initial triage assessment does not replace that evaluation, and the importance of remaining in the ED until their evaluation is complete.  Patient denies having chest pain.  Arm discomfort could be an anginal equivalent.  We will proceed with cardiac evaluation.  With her tachycardia we will add on a D-dimer.   Dorie Rank, MD 12/31/21 2135

## 2022-01-01 LAB — SARS CORONAVIRUS 2 BY RT PCR: SARS Coronavirus 2 by RT PCR: NEGATIVE

## 2022-01-01 LAB — TROPONIN I (HIGH SENSITIVITY): Troponin I (High Sensitivity): 5 ng/L (ref <18)

## 2022-01-01 NOTE — Discharge Instructions (Signed)
You were evaluated in the Emergency Department and after careful evaluation, we did not find any emergent condition requiring admission or further testing in the hospital. ? ?Your exam/testing today is overall reassuring.  Recommend follow-up with your primary care doctor to further discuss your symptoms. ? ?Please return to the Emergency Department if you experience any worsening of your condition.   Thank you for allowing us to be a part of your care. ?

## 2022-01-01 NOTE — ED Provider Notes (Signed)
[facility]-EMERGENCY DEPT [facility] Emergency Department Provider Note MRN:  [id number]  Arrival date & time: 01/01/22     Chief Complaint   Palpitations   History of Present Illness   Jamie Livingston is a 41 y.o. year-old female with no pertinent past medical history presenting to the ED with chief complaint of palpitations.  Palpitations this evening while trying to sleep.  Also noticed some tingling to the left arm, wanted to make sure she was okay.  No chest pain, no shortness of breath, no leg pain or swelling, no abdominal pain, no recent fever or cough.  Has been exercising more recently in an attempt to lose weight.  Review of Systems  A thorough review of systems was obtained and all systems are negative except as noted in the HPI and PMH.   Patient's Health History   History reviewed. No pertinent past medical history.  History reviewed. No pertinent surgical history.  History reviewed. No pertinent family history.  Social History   Socioeconomic History   Marital status: Single    Spouse name: Not on file   Number of children: Not on file   Years of education: Not on file   Highest education level: Not on file  Occupational History   Not on file  Tobacco Use   Smoking status: Never   Smokeless tobacco: Never  Substance and Sexual Activity   Alcohol use: Never   Drug use: Never   Sexual activity: Not on file  Other Topics Concern   Not on file  Social History Narrative   Not on file   Social Determinants of Health   Financial Resource Strain: Not on file  Food Insecurity: Not on file  Transportation Needs: Not on file  Physical Activity: Not on file  Stress: Not on file  Social Connections: Not on file  Intimate Partner Violence: Not on file     Physical Exam   Vitals:   01/01/22 0131 01/01/22 0315  BP: (!) 154/91 (!) 179/111  Pulse: 99 88  Resp: 16 18  Temp: 98.1 F (36.7 C) 97.6 F (36.4 C)  SpO2: 97% 99%    CONSTITUTIONAL:  Well-appearing, NAD NEURO/PSYCH:  Alert and oriented x 3, no focal deficits EYES:  eyes equal and reactive ENT/NECK:  no LAD, no JVD CARDIO: Regular rate, well-perfused, normal S1 and S2 PULM:  CTAB no wheezing or rhonchi GI/GU:  non-distended, non-tender MSK/SPINE:  No gross deformities, no edema SKIN:  no rash, atraumatic   *Additional and/or pertinent findings included in MDM below  Diagnostic and Interventional Summary    EKG Interpretation  Date/Time:  Friday December 31 2021 21:19:34 EDT Ventricular Rate:  113 PR Interval:  170 QRS Duration: 90 QT Interval:  346 QTC Calculation: 474 R Axis:   -9 Text Interpretation: Sinus tachycardia Left ventricular hypertrophy with repolarization abnormality ( R in aVL , Cornell product , Romhilt-Estes ) Cannot rule out Septal infarct , age undetermined Abnormal ECG When compared with ECG of 31-Aug-2020 19:13, No significant change since last tracing Confirmed by Linwood Dibbles [phone]) on 12/31/2021 9:36:42 PM       Labs Reviewed  BASIC METABOLIC PANEL - Abnormal; Notable for the following components:      Result Value   Potassium 3.3 (*)    Glucose, Bld 111 (*)    All other components within normal limits  CBC - Abnormal; Notable for the following components:   MCV 78.4 (*)    Platelets 403 (*)  All other components within normal limits  TSH - Abnormal; Notable for the following components:   TSH 4.539 (*)    All other components within normal limits  SARS CORONAVIRUS 2 BY RT PCR  D-DIMER, QUANTITATIVE  I-STAT BETA HCG BLOOD, ED ([facility], [facility], [facility] ONLY)  TROPONIN I (HIGH SENSITIVITY)  TROPONIN I (HIGH SENSITIVITY)    DG Chest 2 View  Final Result      Medications - No data to display   Procedures  /  Critical Care Procedures  ED Course and Medical Decision Making  Initial Impression and Ddx Differential diagnosis includes anemia, electrolyte disturbance, ACS, PE.  Favoring more benign etiologies such as PVCs in the setting of  mild dehydration given her recent increased exercise.  Past medical/surgical history that increases complexity of ED encounter: Obesity  Interpretation of Diagnostics I personally reviewed the EKG and my interpretation is as follows: Sinus tachycardia, no significant change from prior  Labs are reassuring with no significant blood count or electrolyte disturbance, troponin negative x2, D-dimer negative.  Patient Reassessment and Ultimate Disposition/Management     Patient monitored on telemetry with no signs of arrhythmia or concerning ectopy, appropriate for discharge.  Patient management required discussion with the following services or consulting groups:  None  Complexity of Problems Addressed Acute illness or injury that poses threat of life of bodily function  Additional Data Reviewed and Analyzed Further history obtained from: None  Additional Factors Impacting ED Encounter Risk None  Barth Kirks. Sedonia Small, [place] [email or web address]  Final Clinical Impressions(s) / ED Diagnoses     ICD-10-CM   1. Palpitations  R00.2       ED Discharge Orders     None        Discharge Instructions Discussed with and Provided to Patient:    Discharge Instructions      You were evaluated in the Emergency Department and after careful evaluation, we did not find any emergent condition requiring admission or further testing in the hospital.  Your exam/testing today is overall reassuring.  Recommend follow-up with your primary care doctor to further discuss your symptoms.  Please return to the Emergency Department if you experience any worsening of your condition.   Thank you for allowing [place] to be a part of your care.      Maudie Flakes, MD 01/01/22 [phone]

## 2022-03-15 NOTE — Progress Notes (Signed)
Advanced Hypertension Clinic Initial Assessment:    Date:  03/15/2022   ID:  Jamie Livingston, DOB [birth year], MRN [id number]  PCP:  Wilfrid Lund, PA  Cardiologist:  None  Nephrologist:  Referring MD: Ceasar Lund, PA   CC: Hypertension  History of Present Illness:    Jamie Livingston is a 41 y.o. female with a hx of hypertension, *** here to establish care in the Advanced Hypertension Clinic.   Today,  *** denies any palpitations, chest pain, shortness of breath, or peripheral edema. No lightheadedness, headaches, syncope, orthopnea, or PND.  (+)  Previous antihypertensives:   No past medical history on file.  No past surgical history on file.  Current Medications: No outpatient medications have been marked as taking for the 03/16/22 encounter (Appointment) with Chilton Si, MD.     Allergies:   Patient has no known allergies.   Social History   Socioeconomic History   Marital status: Single    Spouse name: Not on file   Number of children: Not on file   Years of education: Not on file   Highest education level: Not on file  Occupational History   Not on file  Tobacco Use   Smoking status: Never   Smokeless tobacco: Never  Substance and Sexual Activity   Alcohol use: Never   Drug use: Never   Sexual activity: Not on file  Other Topics Concern   Not on file  Social History Narrative   Not on file   Social Determinants of Health   Financial Resource Strain: Not on file  Food Insecurity: Not on file  Transportation Needs: Not on file  Physical Activity: Not on file  Stress: Not on file  Social Connections: Not on file     Family History: The patient's family history is not on file.  ROS:   Please see the history of present illness.     All other systems reviewed and are negative.  EKGs/Labs/Other Studies Reviewed:    ***  EKG:   : Sinus ***. Rate *** bpm.  Recent Labs: 12/31/2021: BUN 11; Creatinine, Ser 0.89;  Hemoglobin 13.0; Platelets 403; Potassium 3.3; Sodium 139; TSH 4.539   Recent Lipid Panel No results found for: "CHOL", "TRIG", "HDL", "CHOLHDL", "VLDL", "LDLCALC", "LDLDIRECT"  Physical Exam:    VS:  There were no vitals taken for this visit. , BMI There is no height or weight on file to calculate BMI. GENERAL:  Well appearing HEENT: Pupils equal round and reactive, fundi not visualized, oral mucosa unremarkable NECK:  No jugular venous distention, waveform within normal limits, carotid upstroke brisk and symmetric, no bruits, no thyromegaly LYMPHATICS:  No cervical adenopathy LUNGS:  Clear to auscultation bilaterally HEART:  RRR.  PMI not displaced or sustained,S1 and S2 within normal limits, no S3, no S4, no clicks, no rubs, *** murmurs ABD:  Flat, positive bowel sounds normal in frequency in pitch, no bruits, no rebound, no guarding, no midline pulsatile mass, no hepatomegaly, no splenomegaly EXT:  2 plus pulses throughout, no edema, no cyanosis, no clubbing SKIN:  No rashes, no nodules NEURO:  Cranial nerves II through XII grossly intact, motor grossly intact throughout PSYCH:  Cognitively intact, oriented to person place and time   ASSESSMENT/PLAN:    No problem-specific Assessment & Plan notes found for this encounter.   Screening for Secondary Hypertension: { Click here to document screening for secondary causes of HTN  :[id number]}    Relevant Labs/Studies:  Latest Ref Rng & Units 12/31/2021   10:23 PM 08/31/2020    7:11 PM  Basic Labs  Sodium 135 - 145 mmol/L 139  137   Potassium 3.5 - 5.1 mmol/L 3.3  3.4   Creatinine 0.44 - 1.00 mg/dL 9.74  1.63        Latest Ref Rng & Units 12/31/2021   10:23 PM  Thyroid   TSH 0.350 - 4.500 uIU/mL 4.539                     she consents to be monitored in our remote patient monitoring program through [employer].  she will track his blood pressure twice daily and understands that these trends will help [place] to adjust her  medications as needed prior to his next appointment.  she *** interested in enrolling in the PREP exercise and nutrition program through the [facility].     Disposition:   *** FU with APP/PharmD in 1 month for the next 3 months.   FU with Kristel Durkee C. Duke Salvia, MD, [facility] in 4 months.   Medication Adjustments/Labs and Tests Ordered: Current medicines are reviewed at length with the patient today.  Concerns regarding medicines are outlined above.   No orders of the defined types were placed in this encounter.  No orders of the defined types were placed in this encounter.   I,Mathew Stumpf,acting as a Neurosurgeon for Chilton Si, MD.,have documented all relevant documentation on the behalf of Chilton Si, MD,as directed by  Chilton Si, MD while in the presence of Chilton Si, MD.  ***  Signed, Carlena Bjornstad  03/15/2022 11:47 AM    [facility]

## 2022-03-16 ENCOUNTER — Ambulatory Visit (INDEPENDENT_AMBULATORY_CARE_PROVIDER_SITE_OTHER): Payer: No Typology Code available for payment source | Admitting: Cardiovascular Disease

## 2022-03-16 ENCOUNTER — Encounter (HOSPITAL_BASED_OUTPATIENT_CLINIC_OR_DEPARTMENT_OTHER): Payer: Self-pay | Admitting: Cardiovascular Disease

## 2022-03-16 VITALS — BP 148/92 | HR 76 | Ht 61.0 in | Wt 380.7 lb

## 2022-03-16 DIAGNOSIS — I1A0 Resistant hypertension: Secondary | ICD-10-CM | POA: Diagnosis not present

## 2022-03-16 DIAGNOSIS — E7800 Pure hypercholesterolemia, unspecified: Secondary | ICD-10-CM

## 2022-03-16 DIAGNOSIS — Z6841 Body Mass Index (BMI) 40.0 and over, adult: Secondary | ICD-10-CM

## 2022-03-16 HISTORY — DX: Morbid (severe) obesity due to excess calories: E66.01

## 2022-03-16 HISTORY — DX: Pure hypercholesterolemia, unspecified: E78.00

## 2022-03-16 MED ORDER — OLMESARTAN-AMLODIPINE-HCTZ 40-10-25 MG PO TABS: 1.0000 | ORAL_TABLET | Freq: Every day | ORAL | 3 refills | Status: DC

## 2022-03-16 MED ORDER — SPIRONOLACTONE 25 MG PO TABS: 25.0000 mg | ORAL_TABLET | Freq: Every day | ORAL | 3 refills | Status: DC

## 2022-03-16 NOTE — Assessment & Plan Note (Addendum)
Ms. Scinto BP is uncontrolled on multiple medications.  We discussed working on lifestyle changes.  We referred her downstairs to our exercise program ([facility]).  Her goal is to get least 150 minutes weekly of exercise.  Her blood pressure goal is less than 130/80.  We will try to simplify her regimen by transitioning her to Tribenzor 10/40/25 mg daily.  Additionally we will add spironolactone 25 mg daily.  Check a BMP in a week.  We will also check for secondary causes of hypertension with an a.m. cortisol, renin, aldosterone level.  We need to rule out renal artery stenosis.  Renal artery Dopplers will not be effective due to body habitus.  Will get a CTA of the abdomen.  She denies symptoms of obstructive sleep apnea.  Thyroid function has been normal.  She is interested in GLP-1 agonist.  I think this is very reasonable along with diet and exercise if her insurance will pay for it.  Will refer to Pharm.D.

## 2022-03-16 NOTE — Assessment & Plan Note (Signed)
Continue atorvastatin

## 2022-03-16 NOTE — Assessment & Plan Note (Signed)
She is struggled with weight loss as above.  Referral to the right start program and Pharm.D. for GLP-1 agonist as above.

## 2022-03-16 NOTE — Patient Instructions (Addendum)
Medication Instructions:  STOP VALSARTAN, AMLODIPINE, HYDROCHLOROTHIAZIDE, AND METOPROLOL   START OLMESARTAN-AMLODIPINE-HCTZ 40-10-25 MG DAILY    Labwork: RENIN/ALDOSTERONE/CORTISOL/BMET IN 1 WEEK    Testing/Procedures: Non-Cardiac CT Angiography (CTA), is a special type of CT scan that uses a computer to produce multi-dimensional views of major blood vessels throughout the body. In CT angiography, a contrast material is injected through an IV to help visualize the blood vessels OF ABDOMEN    Follow-Up: 04/19/2022 10:00 AM WITH PHARM D    You have been referred to PHARM D TO DISCUSS WEGOVY   GO DOWNSTAIRS TO GYM AND REGISTER FOR RIGHT START   DASH Eating Plan DASH stands for "Dietary Approaches to Stop Hypertension." The DASH eating plan is a healthy eating plan that has been shown to reduce high blood pressure (hypertension). It may also reduce your risk for type 2 diabetes, heart disease, and stroke. The DASH eating plan may also help with weight loss. What are tips for following this plan?  General guidelines Avoid eating more than 2,300 mg (milligrams) of salt (sodium) a day. If you have hypertension, you may need to reduce your sodium intake to 1,500 mg a day. Limit alcohol intake to no more than 1 drink a day for nonpregnant women and 2 drinks a day for men. One drink equals 12 oz of beer, 5 oz of wine, or 1 oz of hard liquor. Work with your health care provider to maintain a healthy body weight or to lose weight. Ask what an ideal weight is for you. Get at least 30 minutes of exercise that causes your heart to beat faster (aerobic exercise) most days of the week. Activities may include walking, swimming, or biking. Work with your health care provider or diet and nutrition specialist (dietitian) to adjust your eating plan to your individual calorie needs. Reading food labels  Check food labels for the amount of sodium per serving. Choose foods with less than 5 percent of the  Daily Value of sodium. Generally, foods with less than 300 mg of sodium per serving fit into this eating plan. To find whole grains, look for the word "whole" as the first word in the ingredient list. Shopping Buy products labeled as "low-sodium" or "no salt added." Buy fresh foods. Avoid canned foods and premade or frozen meals. Cooking Avoid adding salt when cooking. Use salt-free seasonings or herbs instead of table salt or sea salt. Check with your health care provider or pharmacist before using salt substitutes. Do not fry foods. Cook foods using healthy methods such as baking, boiling, grilling, and broiling instead. Cook with heart-healthy oils, such as olive, canola, soybean, or sunflower oil. Meal planning Eat a balanced diet that includes: 5 or more servings of fruits and vegetables each day. At each meal, try to fill half of your plate with fruits and vegetables. Up to 6-8 servings of whole grains each day. Less than 6 oz of lean meat, poultry, or fish each day. A 3-oz serving of meat is about the same size as a deck of cards. One egg equals 1 oz. 2 servings of low-fat dairy each day. A serving of nuts, seeds, or beans 5 times each week. Heart-healthy fats. Healthy fats called Omega-3 fatty acids are found in foods such as flaxseeds and coldwater fish, like sardines, salmon, and mackerel. Limit how much you eat of the following: Canned or prepackaged foods. Food that is high in trans fat, such as fried foods. Food that is high in saturated fat, such  as fatty meat. Sweets, desserts, sugary drinks, and other foods with added sugar. Full-fat dairy products. Do not salt foods before eating. Try to eat at least 2 vegetarian meals each week. Eat more home-cooked food and less restaurant, buffet, and fast food. When eating at a restaurant, ask that your food be prepared with less salt or no salt, if possible. What foods are recommended? The items listed may not be a complete list.  Talk with your dietitian about what dietary choices are best for you. Grains Whole-grain or whole-wheat bread. Whole-grain or whole-wheat pasta. Brown rice. Orpah Cobb. Bulgur. Whole-grain and low-sodium cereals. Pita bread. Low-fat, low-sodium crackers. Whole-wheat flour tortillas. Vegetables Fresh or frozen vegetables (raw, steamed, roasted, or grilled). Low-sodium or reduced-sodium tomato and vegetable juice. Low-sodium or reduced-sodium tomato sauce and tomato paste. Low-sodium or reduced-sodium canned vegetables. Fruits All fresh, dried, or frozen fruit. Canned fruit in natural juice (without added sugar). Meat and other protein foods Skinless chicken or [place]. Ground chicken or [place]. Pork with fat trimmed off. Fish and seafood. Egg whites. Dried beans, peas, or lentils. Unsalted nuts, nut butters, and seeds. Unsalted canned beans. Lean cuts of beef with fat trimmed off. Low-sodium, lean deli meat. Dairy Low-fat (1%) or fat-free (skim) milk. Fat-free, low-fat, or reduced-fat cheeses. Nonfat, low-sodium ricotta or cottage cheese. Low-fat or nonfat yogurt. Low-fat, low-sodium cheese. Fats and oils Soft margarine without trans fats. Vegetable oil. Low-fat, reduced-fat, or light mayonnaise and salad dressings (reduced-sodium). Canola, safflower, olive, soybean, and sunflower oils. Avocado. Seasoning and other foods Herbs. Spices. Seasoning mixes without salt. Unsalted popcorn and pretzels. Fat-free sweets. What foods are not recommended? The items listed may not be a complete list. Talk with your dietitian about what dietary choices are best for you. Grains Baked goods made with fat, such as croissants, muffins, or some breads. Dry pasta or rice meal packs. Vegetables Creamed or fried vegetables. Vegetables in a cheese sauce. Regular canned vegetables (not low-sodium or reduced-sodium). Regular canned tomato sauce and paste (not low-sodium or reduced-sodium). Regular tomato and vegetable  juice (not low-sodium or reduced-sodium). Rosita Fire. Olives. Fruits Canned fruit in a light or heavy syrup. Fried fruit. Fruit in cream or butter sauce. Meat and other protein foods Fatty cuts of meat. Ribs. Fried meat. Tomasa Blase. Sausage. Bologna and other processed lunch meats. Salami. Fatback. Hotdogs. Bratwurst. Salted nuts and seeds. Canned beans with added salt. Canned or smoked fish. Whole eggs or egg yolks. Chicken or [place] with skin. Dairy Whole or 2% milk, cream, and half-and-half. Whole or full-fat cream cheese. Whole-fat or sweetened yogurt. Full-fat cheese. Nondairy creamers. Whipped toppings. Processed cheese and cheese spreads. Fats and oils Butter. Stick margarine. Lard. Shortening. Ghee. Bacon fat. Tropical oils, such as coconut, palm kernel, or palm oil. Seasoning and other foods Salted popcorn and pretzels. Onion salt, garlic salt, seasoned salt, table salt, and sea salt. Worcestershire sauce. Tartar sauce. Barbecue sauce. Teriyaki sauce. Soy sauce, including reduced-sodium. Steak sauce. Canned and packaged gravies. Fish sauce. Oyster sauce. Cocktail sauce. Horseradish that you find on the shelf. Ketchup. Mustard. Meat flavorings and tenderizers. Bouillon cubes. Hot sauce and Tabasco sauce. Premade or packaged marinades. Premade or packaged taco seasonings. Relishes. Regular salad dressings. Where to find more information: National Heart, Lung, and Blood Institute: PopSteam.is American Heart Association: www.heart.org Summary The DASH eating plan is a healthy eating plan that has been shown to reduce high blood pressure (hypertension). It may also reduce your risk for type 2 diabetes, heart disease, and stroke. With  the DASH eating plan, you should limit salt (sodium) intake to 2,300 mg a day. If you have hypertension, you may need to reduce your sodium intake to 1,500 mg a day. When on the DASH eating plan, aim to eat more fresh fruits and vegetables, whole grains, lean  proteins, low-fat dairy, and heart-healthy fats. Work with your health care provider or diet and nutrition specialist (dietitian) to adjust your eating plan to your individual calorie needs. This information is not intended to replace advice given to you by your health care provider. Make sure you discuss any questions you have with your health care provider. Document Released: 03/03/2011 Document Revised: 02/24/2017 Document Reviewed: 03/07/2016 Elsevier Patient Education  2020 [employer].

## 2022-03-17 ENCOUNTER — Telehealth (HOSPITAL_BASED_OUTPATIENT_CLINIC_OR_DEPARTMENT_OTHER): Payer: Self-pay | Admitting: *Deleted

## 2022-03-17 ENCOUNTER — Encounter (HOSPITAL_BASED_OUTPATIENT_CLINIC_OR_DEPARTMENT_OTHER): Payer: Self-pay

## 2022-03-17 MED ORDER — OLMESARTAN MEDOXOMIL-HCTZ 40-25 MG PO TABS: 1.0000 | ORAL_TABLET | Freq: Every day | ORAL | 3 refills | Status: DC

## 2022-03-17 MED ORDER — AMLODIPINE BESYLATE 10 MG PO TABS: 10.0000 mg | ORAL_TABLET | Freq: Every day | ORAL | 3 refills | Status: DC

## 2022-03-17 NOTE — Telephone Encounter (Signed)
Received notification from [employer] not covered by insurance Sent over Rx fo Olmesartan-HCT 40-25 mg daily and Amlodipine 10 mg daily  Advised patient, verbalized understanding

## 2022-03-24 ENCOUNTER — Ambulatory Visit (HOSPITAL_COMMUNITY)
Admission: RE | Admit: 2022-03-24 | Discharge: 2022-03-24 | Disposition: A | Discharge status: Home / Self Care | Payer: No Typology Code available for payment source | Source: Ambulatory Visit | Attending: Cardiovascular Disease | Admitting: Cardiovascular Disease

## 2022-03-24 DIAGNOSIS — I1A0 Resistant hypertension: Secondary | ICD-10-CM | POA: Insufficient documentation

## 2022-03-24 MED ORDER — IOHEXOL 350 MG/ML SOLN
100.0000 mL | Freq: Once | INTRAVENOUS | Status: AC | PRN
  Administered 2022-03-24: 100 mL via INTRAVENOUS

## 2022-03-29 ENCOUNTER — Telehealth (HOSPITAL_BASED_OUTPATIENT_CLINIC_OR_DEPARTMENT_OTHER): Payer: Self-pay | Admitting: *Deleted

## 2022-03-29 NOTE — Telephone Encounter (Signed)
Left message to call back  

## 2022-03-29 NOTE — Telephone Encounter (Signed)
Discussed results with patient, below are her concerns  Has had cough since October, thinks wheezing with cough as well. Uses Flonase daily and Zyrtec. Her father went to hospital for bad cough and was diagnosed with cancer, unsure of kind. He ultimately passed away.  Given these two things she is wondering if there is some further testing to do prior to waiting the 6 months for repeat CT  Will forward to Dr Oval Linsey for review

## 2022-03-29 NOTE — Telephone Encounter (Signed)
-----   Message from Skeet Latch, MD sent at 03/26/2022  3:05 PM EST ----- Normal blood flow to the kidneys.  There is some scarring vs a small nodule in the left lung.  Recommend non-contrast chest CT in 6-12 months.

## 2022-03-31 LAB — BASIC METABOLIC PANEL
BUN/Creatinine Ratio: 15 (ref 9–23)
BUN: 12 mg/dL (ref 6–24)
CO2: 25 mmol/L (ref 20–29)
Calcium: 9.3 mg/dL (ref 8.7–10.2)
Chloride: 103 mmol/L (ref 96–106)
Creatinine, Ser: 0.82 mg/dL (ref 0.57–1.00)
Glucose: 117 mg/dL — ABNORMAL HIGH (ref 70–99)
Potassium: 3.6 mmol/L (ref 3.5–5.2)
Sodium: 141 mmol/L (ref 134–144)
eGFR: 91 mL/min/{1.73_m2} (ref >59)

## 2022-03-31 LAB — ALDOSTERONE + RENIN ACTIVITY W/ RATIO
Aldos/Renin Ratio: >59.3 — ABNORMAL HIGH (ref 0.0–30.0)
Aldosterone: 9.9 ng/dL (ref 0.0–30.0)
Renin Activity, Plasma: <0.167 ng/mL/hr — ABNORMAL LOW (ref 0.167–5.380)

## 2022-03-31 LAB — HEMOGLOBIN A1C
Est. average glucose Bld gHb Est-mCnc: 114 mg/dL
Hgb A1c MFr Bld: 5.6 % (ref 4.8–5.6)

## 2022-03-31 LAB — CORTISOL: Cortisol: 8.3 ug/dL (ref 6.2–19.4)

## 2022-04-06 NOTE — Telephone Encounter (Signed)
Pt is returning call.  

## 2022-04-06 NOTE — Telephone Encounter (Signed)
April 06, 2022 Skeet Latch, MD  to Me     04/06/22 11:49 AM I dont' think so, but she can check with her PCP on those symptoms and the story.  TCR  Left message to call back

## 2022-04-06 NOTE — Telephone Encounter (Signed)
Returned call to patient, discussed Dr. Blenda Mounts recommendation patient follow up with PCP.  Pt was agreeable. Georgana Curio MHA RN CCM

## 2022-04-08 ENCOUNTER — Other Ambulatory Visit (HOSPITAL_BASED_OUTPATIENT_CLINIC_OR_DEPARTMENT_OTHER): Payer: Self-pay | Admitting: *Deleted

## 2022-04-08 DIAGNOSIS — R911 Solitary pulmonary nodule: Secondary | ICD-10-CM

## 2022-04-19 ENCOUNTER — Ambulatory Visit (HOSPITAL_BASED_OUTPATIENT_CLINIC_OR_DEPARTMENT_OTHER)
Payer: No Typology Code available for payment source | Admitting: Pharmacist Clinician (PhC)/ Clinical Pharmacy Specialist

## 2022-04-19 ENCOUNTER — Other Ambulatory Visit (HOSPITAL_BASED_OUTPATIENT_CLINIC_OR_DEPARTMENT_OTHER): Payer: Self-pay | Admitting: *Deleted

## 2022-04-19 ENCOUNTER — Other Ambulatory Visit (HOSPITAL_COMMUNITY): Payer: Self-pay

## 2022-04-19 ENCOUNTER — Encounter (HOSPITAL_BASED_OUTPATIENT_CLINIC_OR_DEPARTMENT_OTHER): Payer: Self-pay | Admitting: Pharmacist Clinician (PhC)/ Clinical Pharmacy Specialist

## 2022-04-19 DIAGNOSIS — I1A0 Resistant hypertension: Secondary | ICD-10-CM

## 2022-04-19 NOTE — Assessment & Plan Note (Addendum)
  Patient has not met goal of at least 5% of body weight loss with comprehensive lifestyle modifications alone in the past 3-6 months. Pharmacotherapy is appropriate to pursue as augmentation. Will start Mounjaro dose titrations.   She is not able to use other available weight loss medications (phentermine, diethylpropion) secondary to common side effects of hypertension and palpitatons.   Confirmed patient not pregnant and no personal or family history of medullary thyroid carcinoma (MTC) or Multiple Endocrine Neoplasia syndrome type 2 (MEN 2).   Advised patient on common side effects including nausea, diarrhea, dyspepsia, decreased appetite, and fatigue. Counseled patient on reducing meal size and how to titrate medication to minimize side effects. Patient aware to call if intolerable side effects or if experiencing dehydration, abdominal pain, or dizziness. Patient will adhere to dietary modifications and will target at least 150 minutes of moderate intensity exercise weekly.   Injection technique reviewed at today's visit.    Will send in referral to [facility] exercise program to help with lifestyle modifications  Titration Plan:  Will plan to follow the titration plan as below, pending patient is tolerating each dose before increasing to the next. Can slow titration if needed for tolerability.    -Month 1: Inject 2.5 mg SQ once weekly x 4 weeks -Month 2: Inject 5 mg SQ once weekly x 4 weeks -Month 3: Inject 7.5 mg SQ once weekly x 4 weeks -Month 4+: Inject 10 mg SQ once weekly   Follow up in 3 months.

## 2022-04-19 NOTE — Progress Notes (Unsigned)
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 female patient of Dr Oval Linsey, is in the office today to discuss weight management medications.  She has tried intermittent fasting, as well as increasing her walking times, but has never had much success.  She has never tried phentermine, diethylpropion or other weight loss medications because of problems controlling her blood pressure.  Unfortunately she has to sit much of the day for her job, and is only able to get walks in during the early morning (walking grandson to the bus) or later at night.  This has been much harder in the winter months, as it is usually dark before she gets off work (6 pm).  She has changed her eating habits over the last few months, now eating mostly salmon or chicken (not fried) and plenty of vegetables.    Current weight management medications: none  Previously tried meds: none, did try intermediate fasting  Baseline weight/BMI:  379 lb // 71.65  Insurance payor: [employer]  Diet: changed eating habits - rare to eat fried foods, monitors sodium intake, avoids eating in the evenings, more vegetables and protein, veggies f/f; lives on chicken - baked, salmon, now ground [place] instead of beef; avoids pork/bacon; off soda for 2-3 years; drinks water all day long, ICE water on occasion  Exercise: walking   Family History: yonger brother had MI at 88; mother healthy, no contact with father; maternal great grandmother died from MI  Confirmed patient not pregnant and no personal or family history of medullary thyroid  carcinoma (MTC) or Multiple Endocrine Neoplasia syndrome type 2 (MEN 2).   Social History:   Tobacco: no  Alcohol: occasional, only 1 drink  Caffeine: daily, with a little creamer   Accessory Clinical Findings    Lab Results  Component Value Date   CREATININE 0.82 03/18/2022   BUN 12 03/18/2022   NA 141 03/18/2022   K 3.6 03/18/2022   CL 103 03/18/2022   CO2 25 03/18/2022   Lab Results  Component Value Date   ALT 10 08/31/2020   AST 20 08/31/2020   ALKPHOS 40 08/31/2020   BILITOT 0.4 08/31/2020   Lab Results  Component Value Date   HGBA1C 5.6 03/18/2022      Home Medications/Allergies    Current Outpatient Medications  Medication Sig Dispense Refill   amLODipine (NORVASC) 10 MG tablet Take 1 tablet (10 mg total) by mouth daily. 180 tablet 3   atorvastatin (LIPITOR) 20 MG tablet Take 20 mg by mouth daily.     FEROSUL 325 (65 Fe) MG tablet Take 325 mg by mouth daily.     fluticasone (FLONASE) 50 MCG/ACT nasal spray Place 2 sprays into both nostrils daily. 16 g 2   loratadine (CLARITIN) 10 MG tablet Take 1 tablet (10 mg total) by mouth daily. 30 tablet 0   olmesartan-hydrochlorothiazide (BENICAR HCT) 40-25 MG tablet Take 1 tablet by mouth daily. 90 tablet 3   spironolactone (ALDACTONE) 25 MG tablet Take 1 tablet (25  mg total) by mouth daily. 90 tablet 3   tranexamic acid (LYSTEDA) 650 MG TABS tablet Take 1 tablet by mouth as needed.     No current facility-administered medications for this visit.     No Known Allergies  Assessment & Plan    Morbid obesity ([place])  Patient has not met goal of at least 5% of body weight loss with comprehensive lifestyle modifications alone in the past 3-6 months. Pharmacotherapy is appropriate to pursue as augmentation. Will start Mounjaro dose titrations.   She is not able to use other available weight loss medications (phentermine, diethylpropion) secondary to common side effects of hypertension and palpitatons.   Confirmed  patient not pregnant and no personal or family history of medullary thyroid carcinoma (MTC) or Multiple Endocrine Neoplasia syndrome type 2 (MEN 2).   Advised patient on common side effects including nausea, diarrhea, dyspepsia, decreased appetite, and fatigue. Counseled patient on reducing meal size and how to titrate medication to minimize side effects. Patient aware to call if intolerable side effects or if experiencing dehydration, abdominal pain, or dizziness. Patient will adhere to dietary modifications and will target at least 150 minutes of moderate intensity exercise weekly.   Injection technique reviewed at today's visit.    Will send in referral to [facility] exercise program to help with lifestyle modifications  Titration Plan:  Will plan to follow the titration plan as below, pending patient is tolerating each dose before increasing to the next. Can slow titration if needed for tolerability.    -Month 1: Inject 2.5 mg SQ once weekly x 4 weeks -Month 2: Inject 5 mg SQ once weekly x 4 weeks -Month 3: Inject 7.5 mg SQ once weekly x 4 weeks -Month 4+: Inject 10 mg SQ once weekly   Follow up in 3 months.   Tommy Medal PharmD CPP [facility]  [address] [place], [place] [address] [phone]

## 2022-04-19 NOTE — Patient Instructions (Signed)
We will start the prior authorization process to get Zepbound covered by your insurance.  (Comes in 2.5, 5, 7.5, 10, 12.5, 15 mg doses)  TIPS FOR SUCCESS Write down the reasons why you want to lose weight and post it in a place where you'll see it often. Start small and work your way up. Keep in mind that it takes time to achieve goals, and small steps add up. Any additional movements help to burn calories. Taking the stairs rather than the elevator and parking at the far end of your parking lot are easy ways to start. Brisk walking for at least 30 minutes 4 or more days of the week is an excellent goal to work toward  [place] Did you know that it can take 15 minutes or more for your brain to receive the message that you've eaten? That means that, if you eat less food, but consume it slower, you may still feel satisfied. Eating a lot of fruits and vegetables can also help you feel fuller. Eat off of smaller plates so that moderate portions don't seem too small  If you have any questions or concerns, please reach out to [place].  Tequan Redmon/Chris at [phone].  [place]

## 2022-04-20 ENCOUNTER — Telehealth: Payer: Self-pay

## 2022-04-20 NOTE — Telephone Encounter (Signed)
Pharmacy Patient Advocate Encounter  Received notification from [facility] that the request for prior authorization for Zepbound2.5MG  has been denied due to (this medication not being a covered benefit).please see the detail below.   Please be advised we currently do not have a Pharmacist to review denials, therefore you will need to process appeals accordingly as needed. Thanks for your support at this time.   You may call or fax  to appeal.

## 2022-04-28 ENCOUNTER — Encounter (HOSPITAL_BASED_OUTPATIENT_CLINIC_OR_DEPARTMENT_OTHER): Payer: Self-pay | Admitting: Pharmacist Clinician (PhC)/ Clinical Pharmacy Specialist

## 2022-04-28 MED ORDER — ZEPBOUND 2.5 MG/0.5ML ~~LOC~~ SOAJ: 2.5000 mg | SUBCUTANEOUS | 0 refills | Status: DC

## 2022-04-28 MED ORDER — ZEPBOUND 5 MG/0.5ML ~~LOC~~ SOAJ: 5.0000 mg | SUBCUTANEOUS | 0 refills | Status: DC

## 2022-04-28 MED ORDER — ZEPBOUND 7.5 MG/0.5ML ~~LOC~~ SOAJ: 7.5000 mg | SUBCUTANEOUS | 0 refills | Status: DC

## 2022-04-28 NOTE — Telephone Encounter (Signed)
Patient willing to sign up for [facility] program to bring cost to $550/month.    Will send 3 months.  Pt to report once starting med and we will follow up in 3 months

## 2022-04-29 MED ORDER — ZEPBOUND 7.5 MG/0.5ML ~~LOC~~ SOAJ: 7.5000 mg | SUBCUTANEOUS | 0 refills | Status: DC

## 2022-04-29 MED ORDER — ZEPBOUND 2.5 MG/0.5ML ~~LOC~~ SOAJ: 2.5000 mg | SUBCUTANEOUS | 0 refills | Status: DC

## 2022-04-29 MED ORDER — ZEPBOUND 5 MG/0.5ML ~~LOC~~ SOAJ: 5.0000 mg | SUBCUTANEOUS | 0 refills | Status: DC

## 2022-05-03 ENCOUNTER — Other Ambulatory Visit (HOSPITAL_BASED_OUTPATIENT_CLINIC_OR_DEPARTMENT_OTHER): Payer: Self-pay

## 2022-05-03 MED ORDER — ZEPBOUND 7.5 MG/0.5ML ~~LOC~~ SOAJ
7.5000 mg | SUBCUTANEOUS | 0 refills | Status: DC
  Filled 2022-05-03 – 2022-08-18 (×3): qty 2, 28d supply, fill #0

## 2022-05-03 MED ORDER — ZEPBOUND 2.5 MG/0.5ML ~~LOC~~ SOAJ
2.5000 mg | SUBCUTANEOUS | 0 refills | Status: DC
  Filled 2022-05-03: qty 2, 28d supply, fill #0

## 2022-05-03 MED ORDER — ZEPBOUND 5 MG/0.5ML ~~LOC~~ SOAJ
5.0000 mg | SUBCUTANEOUS | 0 refills | Status: DC
  Filled 2022-05-03 – 2022-06-03 (×6): qty 2, 28d supply, fill #0

## 2022-05-03 NOTE — Addendum Note (Signed)
Addended by: Marcelle Overlie D on: 05/03/2022 01:19 PM   Modules accepted: Orders

## 2022-05-26 ENCOUNTER — Other Ambulatory Visit (HOSPITAL_BASED_OUTPATIENT_CLINIC_OR_DEPARTMENT_OTHER): Payer: Self-pay

## 2022-05-26 ENCOUNTER — Other Ambulatory Visit: Payer: Self-pay

## 2022-05-27 ENCOUNTER — Encounter (HOSPITAL_BASED_OUTPATIENT_CLINIC_OR_DEPARTMENT_OTHER): Payer: Self-pay

## 2022-05-27 ENCOUNTER — Other Ambulatory Visit (HOSPITAL_BASED_OUTPATIENT_CLINIC_OR_DEPARTMENT_OTHER): Payer: Self-pay

## 2022-05-30 ENCOUNTER — Other Ambulatory Visit (HOSPITAL_BASED_OUTPATIENT_CLINIC_OR_DEPARTMENT_OTHER): Payer: Self-pay

## 2022-06-01 ENCOUNTER — Other Ambulatory Visit (HOSPITAL_BASED_OUTPATIENT_CLINIC_OR_DEPARTMENT_OTHER): Payer: Self-pay

## 2022-06-02 ENCOUNTER — Other Ambulatory Visit (HOSPITAL_BASED_OUTPATIENT_CLINIC_OR_DEPARTMENT_OTHER): Payer: Self-pay

## 2022-06-03 ENCOUNTER — Other Ambulatory Visit (HOSPITAL_BASED_OUTPATIENT_CLINIC_OR_DEPARTMENT_OTHER): Payer: Self-pay

## 2022-06-13 ENCOUNTER — Other Ambulatory Visit (HOSPITAL_BASED_OUTPATIENT_CLINIC_OR_DEPARTMENT_OTHER): Payer: Self-pay

## 2022-06-13 MED ORDER — ZEPBOUND 5 MG/0.5ML ~~LOC~~ SOAJ
5.0000 mg | SUBCUTANEOUS | 0 refills | Status: DC
  Filled 2022-06-13 – 2022-06-14 (×2): qty 2, 28d supply, fill #0

## 2022-06-13 NOTE — Addendum Note (Signed)
Addended by: Rockne Menghini on: 06/13/2022 10:00 AM   Modules accepted: Orders

## 2022-06-14 ENCOUNTER — Other Ambulatory Visit (HOSPITAL_BASED_OUTPATIENT_CLINIC_OR_DEPARTMENT_OTHER): Payer: Self-pay

## 2022-06-15 ENCOUNTER — Other Ambulatory Visit (HOSPITAL_BASED_OUTPATIENT_CLINIC_OR_DEPARTMENT_OTHER): Payer: Self-pay

## 2022-07-15 ENCOUNTER — Other Ambulatory Visit: Payer: Self-pay | Admitting: Nurse Practitioner

## 2022-07-15 DIAGNOSIS — N946 Dysmenorrhea, unspecified: Secondary | ICD-10-CM

## 2022-07-15 DIAGNOSIS — N920 Excessive and frequent menstruation with regular cycle: Secondary | ICD-10-CM

## 2022-07-20 ENCOUNTER — Ambulatory Visit: Payer: No Typology Code available for payment source

## 2022-07-20 ENCOUNTER — Encounter (HOSPITAL_BASED_OUTPATIENT_CLINIC_OR_DEPARTMENT_OTHER): Payer: Self-pay | Admitting: Pharmacist Clinician (PhC)/ Clinical Pharmacy Specialist

## 2022-07-20 NOTE — Progress Notes (Deleted)
Office Visit    Patient Name: Jamie Livingston Date of Encounter: 07/20/2022  Primary Care Provider:  Wilfrid Lund, PA Primary Cardiologist:  None  Chief Complaint    Weight management  Significant Past Medical History   hypertension Resistant - currently on 4 medications without control - 148/92 at last visit  hyperlipdemia Most recent LDL 95             No Known Allergies  History of Present Illness    Jamie Livingston is a 42 y.o. female patient of Dr Duke Salvia, is in the office today to discuss weight management medications.  She has tried intermittent fasting, as well as increasing her walking times, but has never had much success.  She has never tried phentermine, diethylpropion or other weight loss medications because of problems controlling her blood pressure.  Unfortunately she has to sit much of the day for her job, and is only able to get walks in during the early morning (walking grandson to the bus) or later at night.  This has been much harder in the winter months, as it is usually dark before she gets off work (6 pm).  She has changed her eating habits over the last few months, now eating mostly salmon or chicken (not fried) and plenty of vegetables.    Today she returns for a 3 month follow up.  Because of supply issues and cyber-attack delays, she is currently on **mg dose  Current weight management medications: none  Previously tried meds: none, did try intermediate fasting  Baseline weight/BMI:  379 lb // 71.65  Insurance payor: [employer]  Diet: changed eating habits - rare to eat fried foods, monitors sodium intake, avoids eating in the evenings, more vegetables and protein, veggies f/f; lives on chicken - baked, salmon, now ground [place] instead of beef; avoids pork/bacon; off soda for 2-3 years; drinks water all day long, ICE water on occasion  Exercise: walking   Family History: yonger brother had MI at 30; mother healthy, no contact with father;  maternal great grandmother died from MI  Confirmed patient not pregnant and no personal or family history of medullary thyroid carcinoma (MTC) or Multiple Endocrine Neoplasia syndrome type 2 (MEN 2).   Social History:   Tobacco: no  Alcohol: occasional, only 1 drink  Caffeine: daily, with a little creamer   Accessory Clinical Findings    Lab Results  Component Value Date   CREATININE 0.82 03/18/2022   BUN 12 03/18/2022   NA 141 03/18/2022   K 3.6 03/18/2022   CL 103 03/18/2022   CO2 25 03/18/2022   Lab Results  Component Value Date   ALT 10 08/31/2020   AST 20 08/31/2020   ALKPHOS 40 08/31/2020   BILITOT 0.4 08/31/2020   Lab Results  Component Value Date   HGBA1C 5.6 03/18/2022      Home Medications/Allergies    Current Outpatient Medications  Medication Sig Dispense Refill   amLODipine (NORVASC) 10 MG tablet Take 1 tablet (10 mg total) by mouth daily. 180 tablet 3   atorvastatin (LIPITOR) 20 MG tablet Take 20 mg by mouth daily.     FEROSUL 325 (65 Fe) MG tablet Take 325 mg by mouth daily.     fluticasone (FLONASE) 50 MCG/ACT nasal spray Place 2 sprays into both nostrils daily. 16 g 2   loratadine (CLARITIN) 10 MG tablet Take 1 tablet (10 mg total) by mouth daily. 30 tablet 0   olmesartan-hydrochlorothiazide (BENICAR  HCT) 40-25 MG tablet Take 1 tablet by mouth daily. 90 tablet 3   spironolactone (ALDACTONE) 25 MG tablet Take 1 tablet (25 mg total) by mouth daily. 90 tablet 3   tirzepatide (ZEPBOUND) 2.5 MG/0.5ML Pen Inject 2.5 mg into the skin once a week. 2 mL 0   tirzepatide (ZEPBOUND) 5 MG/0.5ML Pen Inject 5 mg into the skin once a week. 2 mL 0   tirzepatide (ZEPBOUND) 7.5 MG/0.5ML Pen Inject 7.5 mg into the skin once a week. 2 mL 0   tranexamic acid (LYSTEDA) 650 MG TABS tablet Take 1 tablet by mouth as needed.     No current facility-administered medications for this visit.     No Known Allergies  Assessment & Plan    No problem-specific Assessment &  Plan notes found for this encounter.   Phillips Hay PharmD CPP [facility]  [address] [place], [place] [address] [phone]

## 2022-07-22 ENCOUNTER — Other Ambulatory Visit: Payer: Self-pay | Admitting: Family Medicine

## 2022-07-22 DIAGNOSIS — Z1231 Encounter for screening mammogram for malignant neoplasm of breast: Secondary | ICD-10-CM

## 2022-08-01 ENCOUNTER — Telehealth (HOSPITAL_BASED_OUTPATIENT_CLINIC_OR_DEPARTMENT_OTHER): Payer: Self-pay

## 2022-08-01 NOTE — Telephone Encounter (Addendum)
PT due for CT chest, please assist to schedule   ----- Message from Burnell Blanks, LPN sent at 1/61/0960 10:15 AM EST ----- Ct chest July 2024

## 2022-08-02 ENCOUNTER — Ambulatory Visit
Admission: RE | Admit: 2022-08-02 | Discharge: 2022-08-02 | Disposition: A | Discharge status: Home / Self Care | Payer: No Typology Code available for payment source | Source: Ambulatory Visit | Attending: Nurse Practitioner | Admitting: Nurse Practitioner

## 2022-08-02 ENCOUNTER — Ambulatory Visit
Admission: RE | Admit: 2022-08-02 | Discharge: 2022-08-02 | Disposition: A | Discharge status: Home / Self Care | Payer: No Typology Code available for payment source | Source: Ambulatory Visit

## 2022-08-02 ENCOUNTER — Other Ambulatory Visit (HOSPITAL_BASED_OUTPATIENT_CLINIC_OR_DEPARTMENT_OTHER): Payer: Self-pay

## 2022-08-02 DIAGNOSIS — N920 Excessive and frequent menstruation with regular cycle: Secondary | ICD-10-CM

## 2022-08-02 DIAGNOSIS — N946 Dysmenorrhea, unspecified: Secondary | ICD-10-CM

## 2022-08-02 DIAGNOSIS — Z1231 Encounter for screening mammogram for malignant neoplasm of breast: Secondary | ICD-10-CM

## 2022-08-03 ENCOUNTER — Other Ambulatory Visit (HOSPITAL_BASED_OUTPATIENT_CLINIC_OR_DEPARTMENT_OTHER): Payer: Self-pay

## 2022-08-03 ENCOUNTER — Encounter (HOSPITAL_BASED_OUTPATIENT_CLINIC_OR_DEPARTMENT_OTHER): Payer: Self-pay

## 2022-08-05 ENCOUNTER — Other Ambulatory Visit: Payer: Self-pay

## 2022-08-13 ENCOUNTER — Other Ambulatory Visit (HOSPITAL_BASED_OUTPATIENT_CLINIC_OR_DEPARTMENT_OTHER): Payer: Self-pay | Admitting: Cardiovascular Disease

## 2022-08-15 ENCOUNTER — Other Ambulatory Visit (HOSPITAL_BASED_OUTPATIENT_CLINIC_OR_DEPARTMENT_OTHER): Payer: Self-pay

## 2022-08-15 ENCOUNTER — Telehealth: Payer: Self-pay | Admitting: Pharmacist

## 2022-08-15 MED ORDER — ZEPBOUND 5 MG/0.5ML ~~LOC~~ SOAJ
5.0000 mg | SUBCUTANEOUS | 0 refills | Status: DC
  Filled 2022-08-15: qty 2, 28d supply, fill #0

## 2022-08-15 NOTE — Telephone Encounter (Signed)
Please review for refill. Thank you! 

## 2022-08-15 NOTE — Telephone Encounter (Signed)
Spoke to patient,she missed one dose due to shortage on 7.5 mg Zepbound. Prescription for Zepbound 5 mg 1 month supply sent to the pharmacy. Patient is in agreement to stay on 5 mg dose for 4 more week with hope to get resolution on shortages especially 7.5 mg dose.

## 2022-08-18 ENCOUNTER — Other Ambulatory Visit (HOSPITAL_BASED_OUTPATIENT_CLINIC_OR_DEPARTMENT_OTHER): Payer: Self-pay

## 2022-08-18 ENCOUNTER — Other Ambulatory Visit: Payer: Self-pay

## 2022-08-23 ENCOUNTER — Encounter: Payer: Self-pay | Admitting: Pharmacist Clinician (PhC)/ Clinical Pharmacy Specialist

## 2022-08-23 ENCOUNTER — Other Ambulatory Visit: Payer: Self-pay

## 2022-08-23 ENCOUNTER — Other Ambulatory Visit: Payer: Self-pay | Admitting: Pharmacist Clinician (PhC)/ Clinical Pharmacy Specialist

## 2022-08-23 ENCOUNTER — Other Ambulatory Visit (HOSPITAL_BASED_OUTPATIENT_CLINIC_OR_DEPARTMENT_OTHER): Payer: Self-pay

## 2022-08-23 MED ORDER — ZEPBOUND 15 MG/0.5ML ~~LOC~~ SOAJ
15.0000 mg | SUBCUTANEOUS | 2 refills | Status: DC
  Filled 2022-08-23: qty 2, 28d supply, fill #0

## 2022-08-23 MED ORDER — ZEPBOUND 10 MG/0.5ML ~~LOC~~ SOAJ
10.0000 mg | SUBCUTANEOUS | 1 refills | Status: DC
  Filled 2022-08-23 – 2022-10-10 (×2): qty 2, 28d supply, fill #0
  Filled 2022-12-08: qty 2, 28d supply, fill #1

## 2022-08-23 MED ORDER — ZEPBOUND 12.5 MG/0.5ML ~~LOC~~ SOAJ
12.5000 mg | SUBCUTANEOUS | 1 refills | Status: DC
  Filled 2022-08-23 – 2023-02-16 (×3): qty 2, 28d supply, fill #0
  Filled 2023-04-12 – 2023-04-14 (×2): qty 2, 28d supply, fill #1

## 2022-08-24 ENCOUNTER — Other Ambulatory Visit (HOSPITAL_BASED_OUTPATIENT_CLINIC_OR_DEPARTMENT_OTHER): Payer: Self-pay

## 2022-08-26 ENCOUNTER — Telehealth (HOSPITAL_BASED_OUTPATIENT_CLINIC_OR_DEPARTMENT_OTHER): Payer: Self-pay | Admitting: Cardiovascular Disease

## 2022-08-26 DIAGNOSIS — I1A0 Resistant hypertension: Secondary | ICD-10-CM

## 2022-08-26 NOTE — Telephone Encounter (Signed)
Left message for pateint to call and discuss scheduling the CT chest ordered by Dr. Duke Salvia that is due in July

## 2022-08-31 NOTE — Telephone Encounter (Signed)
Left message for patien tto cann and discuss scheduling the follow up CT chest ordered by Dr, Duke Salvia

## 2022-09-05 ENCOUNTER — Telehealth (HOSPITAL_BASED_OUTPATIENT_CLINIC_OR_DEPARTMENT_OTHER): Payer: Self-pay | Admitting: Cardiovascular Disease

## 2022-09-05 NOTE — Telephone Encounter (Signed)
Spoke with patient regarding the 10/19/22 10:30 am CT chest appointment at [facility].  Arrival time is 10:15 am for check in---patient to have lab work completed Wednesday 10/12/22.  Will mail information to patient and she voiced her understanding

## 2022-09-05 NOTE — Telephone Encounter (Signed)
BMP ordered for testing, mailed to patient with instructions!

## 2022-10-10 ENCOUNTER — Other Ambulatory Visit (HOSPITAL_BASED_OUTPATIENT_CLINIC_OR_DEPARTMENT_OTHER): Payer: Self-pay

## 2022-10-13 ENCOUNTER — Other Ambulatory Visit (HOSPITAL_BASED_OUTPATIENT_CLINIC_OR_DEPARTMENT_OTHER): Payer: Self-pay

## 2022-10-13 LAB — BASIC METABOLIC PANEL
BUN/Creatinine Ratio: 13 (ref 9–23)
BUN: 16 mg/dL (ref 6–24)
CO2: 22 mmol/L (ref 20–29)
Calcium: 10 mg/dL (ref 8.7–10.2)
Chloride: 104 mmol/L (ref 96–106)
Creatinine, Ser: 1.24 mg/dL — ABNORMAL HIGH (ref 0.57–1.00)
Glucose: 110 mg/dL — ABNORMAL HIGH (ref 70–99)
Potassium: 4.8 mmol/L (ref 3.5–5.2)
Sodium: 144 mmol/L (ref 134–144)
eGFR: 55 mL/min/{1.73_m2} — ABNORMAL LOW (ref >59)

## 2022-10-14 ENCOUNTER — Other Ambulatory Visit (HOSPITAL_BASED_OUTPATIENT_CLINIC_OR_DEPARTMENT_OTHER): Payer: Self-pay

## 2022-10-15 ENCOUNTER — Other Ambulatory Visit (HOSPITAL_BASED_OUTPATIENT_CLINIC_OR_DEPARTMENT_OTHER): Payer: Self-pay

## 2022-10-19 ENCOUNTER — Encounter (HOSPITAL_BASED_OUTPATIENT_CLINIC_OR_DEPARTMENT_OTHER): Payer: Self-pay

## 2022-10-19 ENCOUNTER — Encounter (HOSPITAL_BASED_OUTPATIENT_CLINIC_OR_DEPARTMENT_OTHER): Payer: Self-pay | Admitting: Pharmacist Clinician (PhC)/ Clinical Pharmacy Specialist

## 2022-10-19 ENCOUNTER — Ambulatory Visit (HOSPITAL_BASED_OUTPATIENT_CLINIC_OR_DEPARTMENT_OTHER)
Admission: RE | Admit: 2022-10-19 | Discharge: 2022-10-19 | Disposition: A | Discharge status: Home / Self Care | Payer: No Typology Code available for payment source | Source: Ambulatory Visit | Attending: Cardiovascular Disease | Admitting: Cardiovascular Disease

## 2022-10-19 ENCOUNTER — Ambulatory Visit (HOSPITAL_BASED_OUTPATIENT_CLINIC_OR_DEPARTMENT_OTHER)
Payer: No Typology Code available for payment source | Admitting: Pharmacist Clinician (PhC)/ Clinical Pharmacy Specialist

## 2022-10-19 DIAGNOSIS — R911 Solitary pulmonary nodule: Secondary | ICD-10-CM | POA: Diagnosis not present

## 2022-10-19 NOTE — Assessment & Plan Note (Signed)
Patient has now been on Zepbound for the past 5 months.  Has missed weeks due to availability of medication and cost (she is cash paying).    Currently at 10 mg weekly and weight today is down 14.5 kg, BMI down to 64.8 from 71.65    Titration Plan:  Will plan to follow the titration plan as below, pending patient is tolerating each dose before increasing to the next. Can slow titration if needed for tolerability.    Inject 10 mg SQ once weekly x 4 weeks Inject 12.5 mg SQ once weekly x 4 weeks Inject 15 mg SQ once weekly thereafter for 3-4 months, then we will discuss tapering options   Follow up in 4-5 months.

## 2022-10-19 NOTE — Progress Notes (Unsigned)
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 female patient of Dr Duke Salvia, is in the office today to discuss weight management medications.  She has tried intermittent fasting, as well as increasing her walking times, but has never had much success.  She has never tried phentermine, diethylpropion or other weight loss medications because of problems controlling her blood pressure.  Unfortunately she has to sit much of the day for her job, and is only able to get walks in during the early morning (walking grandson to the bus) or later at night.  This has been much harder in the winter months, as it is usually dark before she gets off work (6 pm).  She has changed her eating habits over the last few months, now eating mostly salmon or chicken (not fried) and plenty of vegetables.    Since I saw her in January, she has lost 36.2 pounds (16.5 kg) and is feeling much better.  She notes that her family no longer drops her off at the front door at stores, but she walks from the parking lot.  She purchased a treadmill and gets on it for several 5-10 minute spells during her work day (works from home).  She does have some issues with the cost, and sometimes has to skip a week or more between boxes, as she is paying cash price.  She has twin daughter who just finished high school and has to pay for [employer].  She doesn't report problems other than for the first day after these gaps in dosing and notes that when off the medications for 2-3 week spells, she has not had the urge to eat return.   She didn't lose any weight in the most recent gap, but she didn't gain any either.  And she proudly states that her pant size dropped from 28 to 24.    Current weight management medications: Zepbound   Previously tried meds: none, did try intermediate fasting  Baseline weight/BMI:  379 lb // 71.65 Today's weight/BMI: 342.8 lb //  64.8  Insurance payor: [employer]  Diet: changed eating habits - rare to eat fried foods, monitors sodium intake, avoids eating in the evenings, more vegetables and protein, veggies f/f; lives on chicken - baked, salmon, now ground [place] instead of beef; avoids pork/bacon; off soda for 2-3 years; drinks water all day long, ICE water on occasion.  Today she notes that beef doesn't sit well with her stomach any longer  Exercise: walking has indoor treadmill - even in 5 to 10 minute incriments   Family History: yonger brother had MI at 36; mother healthy, no contact with father; maternal great grandmother died from MI  Confirmed patient not pregnant and no personal or family history of medullary thyroid carcinoma (MTC) or Multiple Endocrine Neoplasia syndrome type 2 (MEN 2).   Social History:   Tobacco: no  Alcohol: occasional, only 1 drink  Caffeine: daily, with a little creamer   Accessory Clinical Findings    Lab  Results  Component Value Date   CREATININE 1.24 (H) 10/12/2022   BUN 16 10/12/2022   NA 144 10/12/2022   K 4.8 10/12/2022   CL 104 10/12/2022   CO2 22 10/12/2022   Lab Results  Component Value Date   ALT 10 08/31/2020   AST 20 08/31/2020   ALKPHOS 40 08/31/2020   BILITOT 0.4 08/31/2020   Lab Results  Component Value Date   HGBA1C 5.6 03/18/2022      Home Medications/Allergies    Current Outpatient Medications  Medication Sig Dispense Refill   amLODipine (NORVASC) 10 MG tablet Take 1 tablet (10 mg total) by mouth daily. 180 tablet 3   atorvastatin (LIPITOR) 20 MG tablet Take 20 mg by mouth daily.     FEROSUL 325 (65 Fe) MG tablet  Take 325 mg by mouth daily.     fluticasone (FLONASE) 50 MCG/ACT nasal spray Place 2 sprays into both nostrils daily. 16 g 2   loratadine (CLARITIN) 10 MG tablet Take 1 tablet (10 mg total) by mouth daily. 30 tablet 0   olmesartan-hydrochlorothiazide (BENICAR HCT) 40-25 MG tablet Take 1 tablet by mouth daily. 90 tablet 3   spironolactone (ALDACTONE) 25 MG tablet Take 1 tablet (25 mg total) by mouth daily. 90 tablet 3   tirzepatide (ZEPBOUND) 10 MG/0.5ML Pen Inject 10 mg into the skin once a week. 2 mL 1   tirzepatide (ZEPBOUND) 12.5 MG/0.5ML Pen Inject 12.5 mg into the skin once a week. 2 mL 1   tirzepatide (ZEPBOUND) 15 MG/0.5ML Pen Inject 15 mg into the skin once a week. 2 mL 2   tranexamic acid (LYSTEDA) 650 MG TABS tablet Take 1 tablet by mouth as needed.     No current facility-administered medications for this visit.     No Known Allergies  Assessment & Plan    Morbid obesity (HCC) Patient has now been on Zepbound for the past 5 months.  Has missed weeks due to availability of medication and cost (she is cash paying).    Currently at 10 mg weekly and weight today is down 14.5 kg, BMI down to 64.8 from 71.65    Titration Plan:  Will plan to follow the titration plan as below, pending patient is tolerating each dose before increasing to the next. Can slow titration if needed for tolerability.    Inject 10 mg SQ once weekly x 4 weeks Inject 12.5 mg SQ once weekly x 4 weeks Inject 15 mg SQ once weekly thereafter for 3-4 months, then we will discuss tapering options   Follow up in 4-5 months.    Phillips Hay PharmD CPP [facility]  [address] [place], [place] [address] [phone]

## 2022-10-19 NOTE — Patient Instructions (Signed)
   TIPS FOR SUCCESS Write down the reasons why you want to lose weight and post it in a place where you'll see it often. Start small and work your way up. Keep in mind that it takes time to achieve goals, and small steps add up. Any additional movements help to burn calories. Taking the stairs rather than the elevator and parking at the far end of your parking lot are easy ways to start. Brisk walking for at least 30 minutes 4 or more days of the week is an excellent goal to work toward  [employer] WHAT IT MEANS TO FEEL FULL Did you know that it can take 15 minutes or more for your brain to receive the message that you've eaten? That means that, if you eat less food, but consume it slower, you may still feel satisfied. Eating a lot of fruits and vegetables can also help you feel fuller. Eat off of smaller plates so that moderate portions don't seem too small  TITRATION PLAN Will plan to follow the titration plan as below, pending patient is tolerating each dose before increasing to the next. Can slow titration if needed for tolerability.    Continue with 10 mg dose then increase to 12.5 and 15 mg as tolerated.  Stay at the 15 mg for 3-4 months and then we can discuss tapering back down.    Follow up in 4-5 months - I will reach out to you in MyChart to schedule something later this year.  If you have any questions or concerns, please reach out to [place].  Marchelle Rinella/Chris at [phone].  THANK YOU FOR CHOOSING [facility]

## 2022-12-05 ENCOUNTER — Telehealth (HOSPITAL_BASED_OUTPATIENT_CLINIC_OR_DEPARTMENT_OTHER): Payer: Self-pay

## 2022-12-05 DIAGNOSIS — I1A0 Resistant hypertension: Secondary | ICD-10-CM

## 2022-12-05 NOTE — Telephone Encounter (Addendum)
Results called to patient who verbalizes understanding, labs ordered.    ----- Message from Chilton Si sent at 12/01/2022  8:08 PM EDT ----- CT shows a very small nodule in the lung.  Given that you have no smoking history this is very low risk.  Radiology doesn't recommend any further follow up.  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  Recommend checking am cortisol, renin, aldosterone, catecholamines and metanephrines.

## 2022-12-08 ENCOUNTER — Other Ambulatory Visit: Payer: Self-pay

## 2022-12-08 ENCOUNTER — Other Ambulatory Visit (HOSPITAL_COMMUNITY): Payer: Self-pay

## 2022-12-08 ENCOUNTER — Encounter: Payer: Self-pay | Admitting: Pharmacist

## 2023-01-02 ENCOUNTER — Telehealth: Payer: Self-pay | Admitting: Pharmacist Clinician (PhC)/ Clinical Pharmacy Specialist

## 2023-01-02 NOTE — Telephone Encounter (Signed)
Check in via MyChart for Zepbound

## 2023-02-15 ENCOUNTER — Other Ambulatory Visit (HOSPITAL_BASED_OUTPATIENT_CLINIC_OR_DEPARTMENT_OTHER): Payer: Self-pay

## 2023-02-16 ENCOUNTER — Encounter (HOSPITAL_BASED_OUTPATIENT_CLINIC_OR_DEPARTMENT_OTHER): Payer: Self-pay

## 2023-02-16 ENCOUNTER — Other Ambulatory Visit (HOSPITAL_COMMUNITY): Payer: Self-pay

## 2023-02-16 ENCOUNTER — Other Ambulatory Visit (HOSPITAL_BASED_OUTPATIENT_CLINIC_OR_DEPARTMENT_OTHER): Payer: Self-pay

## 2023-02-16 ENCOUNTER — Other Ambulatory Visit: Payer: Self-pay

## 2023-02-17 ENCOUNTER — Other Ambulatory Visit (HOSPITAL_COMMUNITY): Payer: Self-pay

## 2023-02-27 ENCOUNTER — Other Ambulatory Visit (HOSPITAL_BASED_OUTPATIENT_CLINIC_OR_DEPARTMENT_OTHER): Payer: Self-pay

## 2023-04-14 ENCOUNTER — Other Ambulatory Visit (HOSPITAL_COMMUNITY): Payer: Self-pay

## 2023-04-18 ENCOUNTER — Other Ambulatory Visit (HOSPITAL_BASED_OUTPATIENT_CLINIC_OR_DEPARTMENT_OTHER): Payer: Self-pay | Admitting: Cardiovascular Disease

## 2023-05-12 ENCOUNTER — Encounter: Payer: Self-pay | Admitting: Pharmacist Clinician (PhC)/ Clinical Pharmacy Specialist

## 2023-05-19 ENCOUNTER — Other Ambulatory Visit (HOSPITAL_BASED_OUTPATIENT_CLINIC_OR_DEPARTMENT_OTHER): Payer: Self-pay

## 2023-05-19 ENCOUNTER — Telehealth: Payer: Self-pay | Admitting: Cardiovascular Disease

## 2023-05-19 MED ORDER — ZEPBOUND 12.5 MG/0.5ML ~~LOC~~ SOAJ
12.5000 mg | SUBCUTANEOUS | 0 refills | Status: DC
  Filled 2023-05-19 – 2023-05-22 (×2): qty 2, 28d supply, fill #0

## 2023-05-19 NOTE — Telephone Encounter (Signed)
*  STAT* If patient is at the pharmacy, call can be transferred to refill team.   1. Which medications need to be refilled? (please list name of each medication and dose if known)  tirzepatide (ZEPBOUND) 12.5 MG/0.5ML Pen  2. Which pharmacy/location (including street and city if local pharmacy) is medication to be sent to?  [facility]   3. Do they need a 30 day or 90 day supply? 30

## 2023-05-22 ENCOUNTER — Other Ambulatory Visit (HOSPITAL_BASED_OUTPATIENT_CLINIC_OR_DEPARTMENT_OTHER): Payer: Self-pay

## 2023-05-22 ENCOUNTER — Other Ambulatory Visit: Payer: Self-pay

## 2023-05-22 ENCOUNTER — Other Ambulatory Visit (HOSPITAL_COMMUNITY): Payer: Self-pay

## 2023-06-23 ENCOUNTER — Other Ambulatory Visit: Payer: Self-pay | Admitting: Cardiovascular Disease

## 2023-06-26 ENCOUNTER — Encounter (HOSPITAL_BASED_OUTPATIENT_CLINIC_OR_DEPARTMENT_OTHER): Payer: Self-pay

## 2023-06-26 ENCOUNTER — Other Ambulatory Visit (HOSPITAL_BASED_OUTPATIENT_CLINIC_OR_DEPARTMENT_OTHER): Payer: Self-pay

## 2023-06-26 MED ORDER — ZEPBOUND 12.5 MG/0.5ML ~~LOC~~ SOAJ
12.5000 mg | SUBCUTANEOUS | 1 refills | Status: DC
  Filled 2023-06-26: qty 2, 28d supply, fill #0
  Filled 2023-07-24: qty 2, 28d supply, fill #1

## 2023-06-27 ENCOUNTER — Encounter (HOSPITAL_BASED_OUTPATIENT_CLINIC_OR_DEPARTMENT_OTHER): Payer: Self-pay | Admitting: Cardiovascular Disease

## 2023-06-27 ENCOUNTER — Ambulatory Visit (INDEPENDENT_AMBULATORY_CARE_PROVIDER_SITE_OTHER): Payer: No Typology Code available for payment source | Admitting: Cardiovascular Disease

## 2023-06-27 VITALS — BP 134/80 | HR 110 | Ht 61.0 in | Wt 312.0 lb

## 2023-06-27 DIAGNOSIS — I1A0 Resistant hypertension: Secondary | ICD-10-CM

## 2023-06-27 DIAGNOSIS — R0602 Shortness of breath: Secondary | ICD-10-CM

## 2023-06-27 DIAGNOSIS — R011 Cardiac murmur, unspecified: Secondary | ICD-10-CM | POA: Diagnosis not present

## 2023-06-27 DIAGNOSIS — E7800 Pure hypercholesterolemia, unspecified: Secondary | ICD-10-CM

## 2023-06-27 DIAGNOSIS — R000 Tachycardia, unspecified: Secondary | ICD-10-CM

## 2023-06-27 NOTE — Patient Instructions (Signed)
 Medication Instructions:  Your physician recommends that you continue on your current medications as directed. Please refer to the Current Medication list given to you today.   Labwork: CMET/THYROID PANEL/CBC/BNP/RENIN/ALDOSTERONE/MAGNESIUM LEVEL SOON   FASTING LP/CMET IN 3 MONTHS   Testing/Procedures: Your physician has requested that you have an echocardiogram. Echocardiography is a painless test that uses sound waves to create images of your heart. It provides your doctor with information about the size and shape of your heart and how well your heart's chambers and valves are working. This procedure takes approximately one hour. There are no restrictions for this procedure. Please do NOT wear cologne, perfume, aftershave, or lotions (deodorant is allowed). Please arrive 15 minutes prior to your appointment time.  Please note: We ask at that you not bring children with you during ultrasound (echo/ vascular) testing. Due to room size and safety concerns, children are not allowed in the ultrasound rooms during exams. Our front office staff cannot provide observation of children in our lobby area while testing is being conducted. An adult accompanying a patient to their appointment will only be allowed in the ultrasound room at the discretion of the ultrasound technician under special circumstances. We apologize for any inconvenience.   Follow-Up: ABOUT 1 WEEK AFTER LABS IN 3 MONTHS WITH DR [place] OR CAITLIN W NP   If you need a refill on your cardiac medications before your next appointment, please call your pharmacy.

## 2023-06-27 NOTE — Progress Notes (Signed)
 40/25 mg daily.  [facility]pert[facility] Initial Assessment:    Date:  06/27/2023   ID:  Jamie Livingston, DOB 12/09/1980, MRN [id number]  PCP:  Wilfrid Lund, PA  Cardiologist:  None  Nephrologist:  Referring MD: Wilfrid Lund, PA   CC: Hypertension  History of Present Illness:    Jamie Livingston is a 43 y.o. female with a hx of hypertension, and hyperlipidemia here for follow up.  She first established care in the [facility] 02/2022.  She saw her PCP 01/2022 and blood pressure was uncontrolled on metoprolol, HCTZ, valsartan, and amlodipine.  She was diagnosed with hypertension around age 76.  At the time, she was on the way home from her son's graduation. She suddenly became really lightheaded, hot, and nauseous while in the car. She leaned out to vomit, and the next thing she knew she was on the ground after passing out and her daughter was telling her she was bleeding. EMS found her blood pressure to be 200+ systolic, but could not determine a diastolic reading. She deferred EMS transport to the hospital. She later went to the [facility] and was transferred to [facility] hospital. In clinic today, her blood pressure is initially 152/95, which she states is similar to her home readings. She has seen blood pressures as high as the 170s/85-90. At home she uses a wrist cuff.   [name] was referred to the [facility].  Her medication was simplified to Tribenzor and spironolactone was added.  CT-A revealed no adrenal adenomas and renal arteries were patent.  She followed up with our pharmacist and was started on Zepbound.   Discussed the use of AI scribe software for clinical note transcription with the patient, who gave verbal consent to proceed.  History of Present Illness Jamie Livingston is a 43 year old female with hypertension and hyperlipidemia who presents with palpitations and leg swelling.  She experiences palpitations described as 'little  flutters' in her chest, which began after starting Zepbound. These palpitations are not alarming or distressing and occur rarely. No associated symptoms such as acid reflux or heartburn, but she does experience sulfur burps. Her heart rate has been intermittently fast, with a recent EKG showing a rate of 110 bpm in normal rhythm.  She reports swelling in her legs, particularly at the back of her thighs, noticed over the past four to five months. The swelling is more pronounced by the end of the day and improves overnight. She attributes this to prolonged sitting and has increased her activity, using a vibration plate and a wireless headset to move more during work hours.  She has a history of anemia and is currently taking iron supplements. She experiences extremely heavy menstrual cycles, which may contribute to her anemia. She has been anemic in the past and is concerned about her current blood count.  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 She also uses resistance bands and a vibration plate to maintain activity levels. She feels fine during exercise without any shortness of breath, dizziness, or lightheadedness.  Her family history is significant for heart disease, with her mother and grandfather both on atorvastatin for hyperlipidemia. Her younger brother is on metoprolol. She was previously on metoprolol but stopped it, along with other medications, after a previous visit. She is currently taking amlodipine, spironolactone, and olmesartan for her hypertension.  She recently restarted atorvastatin after a brief discontinuation.  Previous antihypertensives:   Past Medical History:  Diagnosis Date   HTN (hypertension)    Hyperlipidemia    Morbid obesity (HCC) 03/16/2022   Pure hypercholesterolemia 03/16/2022    Past Surgical History:  Procedure Laterality Date    CESAREAN SECTION  2006    Current Medications: Current Meds  Medication Sig   albuterol (VENTOLIN HFA) 108 (90 Base) MCG/ACT inhaler Inhale 2 puffs into the lungs as needed.   amLODipine (NORVASC) 10 MG tablet TAKE 1 TABLET BY MOUTH DAILY   atorvastatin (LIPITOR) 20 MG tablet Take 20 mg by mouth daily.   FEROSUL 325 (65 Fe) MG tablet Take 325 mg by mouth daily.   fluticasone (FLONASE) 50 MCG/ACT nasal spray Place 2 sprays into both nostrils daily.   loratadine (CLARITIN) 10 MG tablet Take 1 tablet (10 mg total) by mouth daily.   olmesartan-hydrochlorothiazide (BENICAR HCT) 40-25 MG tablet TAKE 1 TABLET BY MOUTH DAILY   spironolactone (ALDACTONE) 25 MG tablet TAKE 1 TABLET(25 MG) BY MOUTH DAILY   tirzepatide (ZEPBOUND) 12.5 MG/0.5ML Pen Inject 12.5 mg into the skin once a week.   tirzepatide (ZEPBOUND) 15 MG/0.5ML Pen Inject 15 mg into the skin once a week.   tranexamic acid (LYSTEDA) 650 MG TABS tablet Take 1 tablet by mouth as needed.     Allergies:   Patient has no known allergies.   Social History   Socioeconomic History   Marital status: Single    Spouse name: Not on file   Number of children: Not on file   Years of education: Not on file   Highest education level: Not on file  Occupational History   Not on file  Tobacco Use   Smoking status: Never   Smokeless tobacco: Never  Substance and Sexual Activity   Alcohol use: Never   Drug use: Never   Sexual activity: Not on file  Other Topics Concern   Not on file  Social History Narrative   Not on file   Social Drivers of Health   Financial Resource Strain: Not on file  Food Insecurity: No Food Insecurity (03/16/2022)   Hunger Vital Sign    Worried About Running Out of Food in the Last Year: Never true    Ran Out of Food in the Last Year: Never true  Transportation Needs: No Transportation Needs (03/16/2022)   PRAPARE - Administrator, Civil Service (Medical): No    Lack of Transportation  (Non-Medical): No  Physical Activity: Inactive (03/16/2022)   Exercise Vital Sign    Days of Exercise per Week: 0 days    Minutes of Exercise per Session: 0 min  Stress: Not on file  Social Connections: Not on file     Family History: The patient's family history includes Heart disease in her mother; Heart failure in her maternal grandmother; Hyperlipidemia in her maternal grandfather and mother; Hypertension in her maternal grandfather, maternal grandmother, and mother.  ROS:   Please see the history of present illness.    (+) Left knee stiffness and edema (+) Persistent cough (+) Sinus drainage (+) Snoring All other systems reviewed and are negative.  EKGs/Labs/Other Studies Reviewed:    Chest X-ray  12/31/2021: FINDINGS: Frontal and lateral views of the chest demonstrate an unremarkable cardiac silhouette. No acute airspace disease, effusion, or pneumothorax. No acute bony abnormality.   IMPRESSION: 1. No acute intrathoracic process.  EKG:  EKG is personally reviewed. 03/16/2022: Sinus rhythm. Rate  76 bpm. LVH. Secondary repolarization abnormality.  Recent Labs: 10/12/2022: BUN 16; Creatinine, Ser 1.24; Potassium 4.8; Sodium 144   Recent Lipid Panel No results found for: "CHOL", "TRIG", "HDL", "CHOLHDL", "VLDL", "LDLCALC", "LDLDIRECT"  Physical Exam:    VS:  BP 134/80 (BP Location: Right Arm, Patient Position: Sitting, Cuff Size: Large) Comment (Cuff Size): thigh cuff  Pulse (!) 110   Ht 5\' 1"  (1.549 m)   Wt (!) 312 lb (141.5 kg)   SpO2 96%   BMI 58.95 kg/m  , BMI Body mass index is 58.95 kg/m. GENERAL:  Well appearing HEENT: Pupils equal round and reactive, fundi not visualized, oral mucosa unremarkable NECK:  No jugular venous distention, waveform within normal limits, carotid upstroke brisk and symmetric, no bruits, no thyromegaly LUNGS:  Clear to auscultation bilaterally HEART:  RRR.  PMI not displaced or sustained,S1 and S2 within normal limits, no S3, no  S4, no clicks, no rubs, II/VI systolic murmur at the LLSB ABD:  Flat, positive bowel sounds normal in frequency in pitch, no bruits, no rebound, no guarding, no midline pulsatile mass, no hepatomegaly, no splenomegaly EXT:  2 plus pulses throughout, no edema, no cyanosis, no clubbing SKIN:  No rashes, no nodules NEURO:  Cranial nerves II through XII grossly intact, motor grossly intact throughout PSYCH:  Cognitively intact, oriented to person place and time   ASSESSMENT/PLAN:    Assessment & Plan # Palpitations Intermittent palpitations since starting Zetban, with no associated distress or alarming symptoms. Differential includes thyroid dysfunction, anemia, or electrolyte imbalance. Heart rate was 110 bpm with normal rhythm on EKG. Anemia and abnormal TSH are potential contributors to palpitations and elevated heart rate. Electrolyte imbalance, particularly magnesium, will also be assessed. - Order TSH, T3, T4 to assess thyroid function - Order CBC to evaluate for anemia - Check magnesium and electrolytes - Schedule echocardiogram to assess heart function  #  Hypertension Resistant hypertension with previous indeterminate aldosterone and renin levels. Current medications include amlodipine, spironolactone, and olmesartan.Monitoring of blood pressure and further evaluation of hormone levels are planned. - Check renin and aldosterone levels in AM - Monitor blood pressure at home.  BP goal <130/80  # Hyperlipidemia Elevated cholesterol levels suggest possible familial hyperlipidemia. Family history of heart disease noted. Atorvastatin was restarted based on elevated cholesterol levels and family history, indicating a potential genetic component. - Continue atorvastatin - Recheck cholesterol levels in a few months  # Edema Dependent edema in thighs, likely due to prolonged sitting. No signs of heart failure as swelling resolves overnight and no orthopnea. Increased physical activity is  encouraged. An echocardiogram is planned to rule out heart failure due to intermittent tachycardia. - Encourage continued physical activity and use of vibration plate - Schedule echocardiogram to assess heart function  # Morbid obesity: Continue Zepbound, diet and exercise.    Screening for Secondary Hypertension:     03/16/2022    2:43 PM  Causes  Drugs/Herbals Screened     - Comments tries to limit sodium and eats at home. Rare caffeine or EtOH.  Sleep Apnea Screened     - Comments snores.  No daytime somnolence or morning sleepiness.  Thyroid Disease Screened     - Comments TSH wnl  Hyperaldosteronism Screened     - Comments check renin and aldosterone  Pheochromocytoma N/A  Cushing's Syndrome Screened     - Comments check cortisol  Hyperparathyroidism Screened  Coarctation of the Aorta Screened     - Comments BP symmetric  Compliance Screened    Relevant Labs/Studies:    Latest Ref Rng & Units 10/12/2022    1:07 PM 03/18/2022    8:20 AM 12/31/2021   10:23 PM  Basic Labs  Sodium 134 - 144 mmol/L 144  141  139   Potassium 3.5 - 5.2 mmol/L 4.8  3.6  3.3   Creatinine 0.57 - 1.00 mg/dL 6.64  4.03  4.74        Latest Ref Rng & Units 12/31/2021   10:23 PM  Thyroid   TSH 0.350 - 4.500 uIU/mL 4.539      Disposition:    FU with Shloime Keilman C. Duke Salvia, MD, [facility] in 3 months.  Medication Adjustments/Labs and Tests Ordered: Current medicines are reviewed at length with the patient today.  Concerns regarding medicines are outlined above.   Orders Placed This Encounter  Procedures   CBC with Differential/Platelet   Aldosterone + renin activity w/ ratio   Magnesium   B Nat Peptide   Thyroid Panel With TSH   Comprehensive metabolic panel with GFR   Lipid panel   Comprehensive metabolic panel with GFR   EKG 25-ZDGL   ECHOCARDIOGRAM COMPLETE   No orders of the defined types were placed in this encounter.    Signed, Chilton Si, MD  06/27/2023 6:44 PM    [facility]

## 2023-07-04 ENCOUNTER — Other Ambulatory Visit: Payer: Self-pay | Admitting: Family Medicine

## 2023-07-04 DIAGNOSIS — Z0000 Encounter for general adult medical examination without abnormal findings: Secondary | ICD-10-CM

## 2023-07-24 ENCOUNTER — Other Ambulatory Visit (HOSPITAL_BASED_OUTPATIENT_CLINIC_OR_DEPARTMENT_OTHER): Payer: Self-pay

## 2023-07-26 ENCOUNTER — Other Ambulatory Visit (HOSPITAL_BASED_OUTPATIENT_CLINIC_OR_DEPARTMENT_OTHER)

## 2023-07-26 ENCOUNTER — Other Ambulatory Visit (HOSPITAL_BASED_OUTPATIENT_CLINIC_OR_DEPARTMENT_OTHER): Payer: Self-pay

## 2023-08-02 LAB — CBC WITH DIFFERENTIAL/PLATELET
Basophils Absolute: 0.1 10*3/uL (ref 0.0–0.2)
Basos: 1 %
EOS (ABSOLUTE): 0.4 10*3/uL (ref 0.0–0.4)
Eos: 6 %
Hematocrit: 37.5 % (ref 34.0–46.6)
Hemoglobin: 12.2 g/dL (ref 11.1–15.9)
Immature Grans (Abs): 0 10*3/uL (ref 0.0–0.1)
Immature Granulocytes: 0 %
Lymphocytes Absolute: 2.5 10*3/uL (ref 0.7–3.1)
Lymphs: 34 %
MCH: 27.4 pg (ref 26.6–33.0)
MCHC: 32.5 g/dL (ref 31.5–35.7)
MCV: 84 fL (ref 79–97)
Monocytes Absolute: 0.4 10*3/uL (ref 0.1–0.9)
Monocytes: 6 %
Neutrophils Absolute: 3.9 10*3/uL (ref 1.4–7.0)
Neutrophils: 53 %
Platelets: 396 10*3/uL (ref 150–450)
RBC: 4.45 x10E6/uL (ref 3.77–5.28)
RDW: 15.8 % — ABNORMAL HIGH (ref 11.7–15.4)
WBC: 7.3 10*3/uL (ref 3.4–10.8)

## 2023-08-02 LAB — COMPREHENSIVE METABOLIC PANEL WITH GFR
ALT: 27 IU/L (ref 0–32)
AST: 20 IU/L (ref 0–40)
Albumin: 4.3 g/dL (ref 3.9–4.9)
Alkaline Phosphatase: 51 IU/L (ref 44–121)
BUN/Creatinine Ratio: 12 (ref 9–23)
BUN: 13 mg/dL (ref 6–24)
Bilirubin Total: 0.2 mg/dL (ref 0.0–1.2)
CO2: 22 mmol/L (ref 20–29)
Calcium: 9.6 mg/dL (ref 8.7–10.2)
Chloride: 103 mmol/L (ref 96–106)
Creatinine, Ser: 1.09 mg/dL — ABNORMAL HIGH (ref 0.57–1.00)
Globulin, Total: 3 g/dL (ref 1.5–4.5)
Glucose: 86 mg/dL (ref 70–99)
Potassium: 4.2 mmol/L (ref 3.5–5.2)
Sodium: 141 mmol/L (ref 134–144)
Total Protein: 7.3 g/dL (ref 6.0–8.5)
eGFR: 64 mL/min/{1.73_m2} (ref >59)

## 2023-08-02 LAB — ALDOSTERONE + RENIN ACTIVITY W/ RATIO
Aldos/Renin Ratio: 2.6 (ref 0.0–30.0)
Aldosterone: 27 ng/dL (ref 0.0–30.0)
Renin Activity, Plasma: 10.315 ng/mL/h — ABNORMAL HIGH (ref 0.167–5.380)

## 2023-08-02 LAB — THYROID PANEL WITH TSH
Free Thyroxine Index: 2.3 (ref 1.2–4.9)
T3 Uptake Ratio: 29 % (ref 24–39)
T4, Total: 7.9 ug/dL (ref 4.5–12.0)
TSH: 2.99 u[IU]/mL (ref 0.450–4.500)

## 2023-08-02 LAB — MAGNESIUM: Magnesium: 1.9 mg/dL (ref 1.6–2.3)

## 2023-08-02 LAB — BRAIN NATRIURETIC PEPTIDE: BNP: 35.6 pg/mL (ref 0.0–100.0)

## 2023-08-03 ENCOUNTER — Ambulatory Visit
Admission: RE | Admit: 2023-08-03 | Discharge: 2023-08-03 | Disposition: A | Discharge status: Home / Self Care | Payer: Self-pay | Source: Ambulatory Visit | Attending: Family Medicine | Admitting: Family Medicine

## 2023-08-03 DIAGNOSIS — Z0000 Encounter for general adult medical examination without abnormal findings: Secondary | ICD-10-CM

## 2023-08-14 ENCOUNTER — Ambulatory Visit (HOSPITAL_BASED_OUTPATIENT_CLINIC_OR_DEPARTMENT_OTHER): Payer: Self-pay | Admitting: Cardiovascular Disease

## 2023-08-14 DIAGNOSIS — I421 Obstructive hypertrophic cardiomyopathy: Secondary | ICD-10-CM

## 2023-08-16 ENCOUNTER — Ambulatory Visit (INDEPENDENT_AMBULATORY_CARE_PROVIDER_SITE_OTHER)

## 2023-08-16 DIAGNOSIS — I1A0 Resistant hypertension: Secondary | ICD-10-CM | POA: Diagnosis not present

## 2023-08-16 DIAGNOSIS — R0602 Shortness of breath: Secondary | ICD-10-CM

## 2023-08-16 DIAGNOSIS — R011 Cardiac murmur, unspecified: Secondary | ICD-10-CM

## 2023-08-16 DIAGNOSIS — R000 Tachycardia, unspecified: Secondary | ICD-10-CM | POA: Diagnosis not present

## 2023-08-17 LAB — ECHOCARDIOGRAM COMPLETE
Area-P 1/2: 4.36 cm2
S' Lateral: 2.31 cm

## 2023-08-29 ENCOUNTER — Other Ambulatory Visit: Payer: Self-pay | Admitting: Cardiovascular Disease

## 2023-08-29 ENCOUNTER — Other Ambulatory Visit (HOSPITAL_BASED_OUTPATIENT_CLINIC_OR_DEPARTMENT_OTHER): Payer: Self-pay

## 2023-08-29 DIAGNOSIS — Z6841 Body Mass Index (BMI) 40.0 and over, adult: Secondary | ICD-10-CM

## 2023-08-29 IMAGING — MG MM DIGITAL SCREENING BILAT W/ TOMO AND CAD
8 of 17 series · 8 of 40 positions shown · non-contrast
Comparison: None.

CLINICAL DATA: Screening.

EXAM:
DIGITAL SCREENING BILATERAL MAMMOGRAM WITH TOMOSYNTHESIS AND CAD
TECHNIQUE: Bilateral screening digital craniocaudal and mediolateral oblique
mammograms were obtained. Bilateral screening digital breast
tomosynthesis was performed. The images were evaluated with
computer-aided detection.

[R CC synth-2D (1 of 2)]
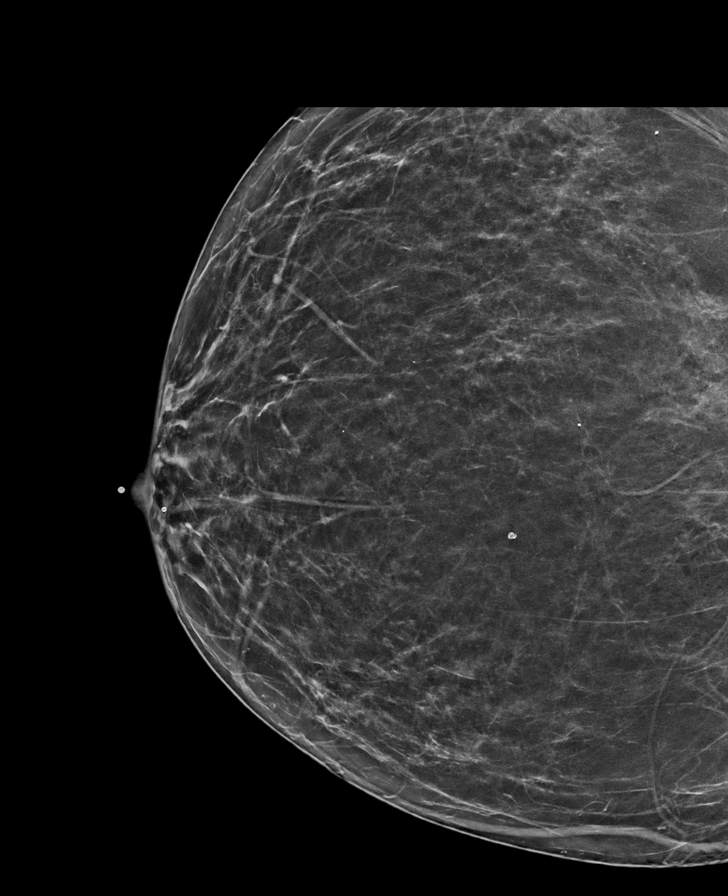

[R XCCL synth-2D]
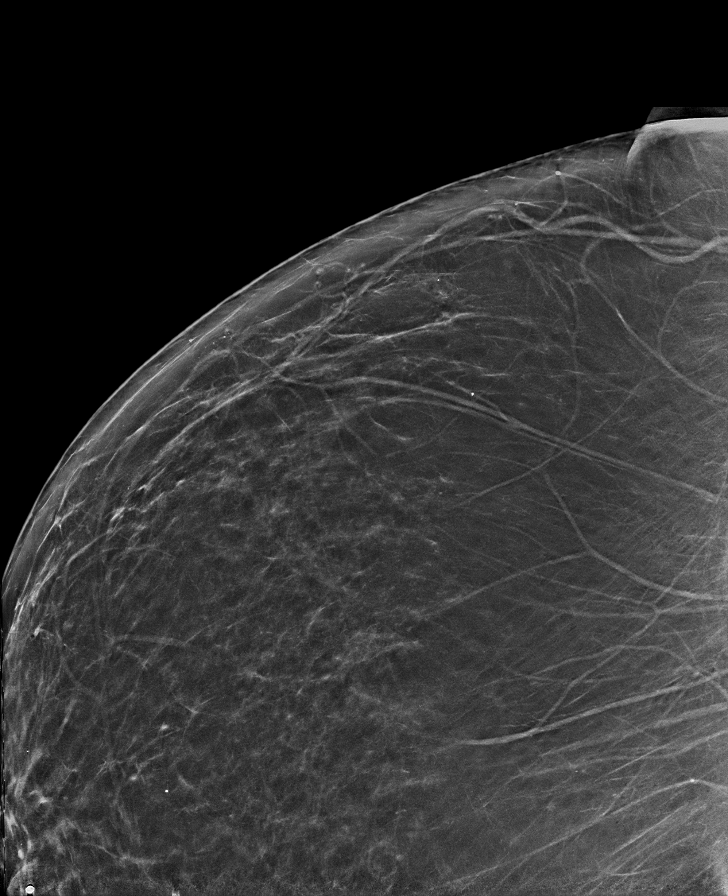

[L MLO synth-2D (1 of 2)]
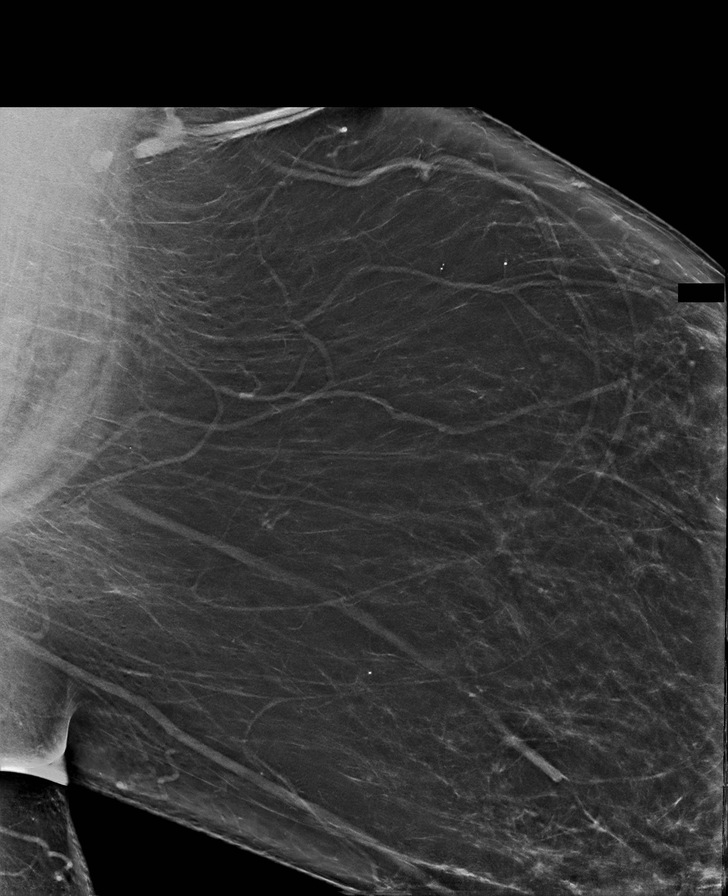

[L MLO synth-2D (2 of 2)]
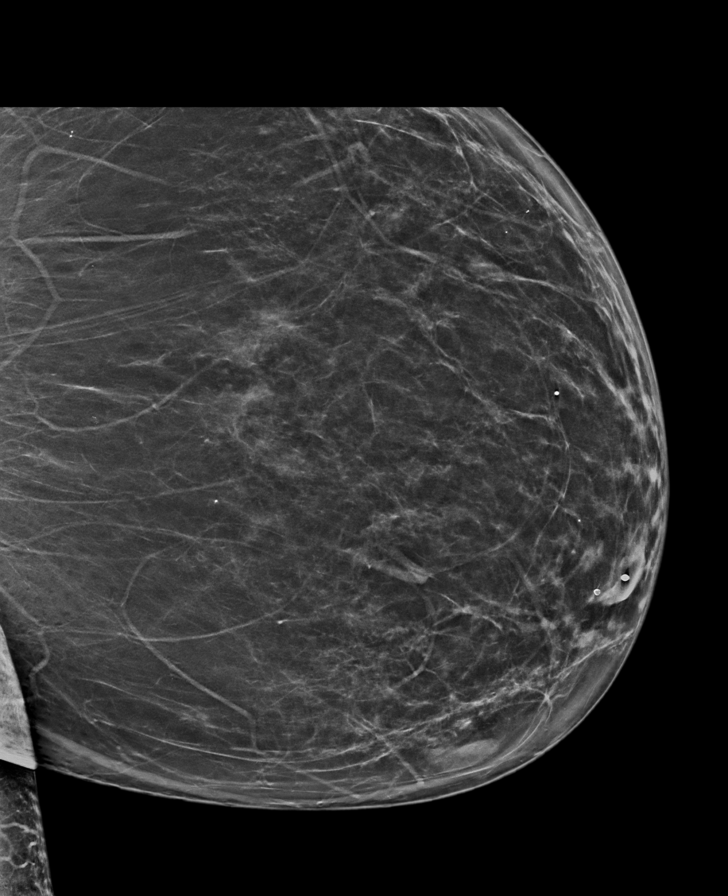

[R CC synth-2D (2 of 2)]
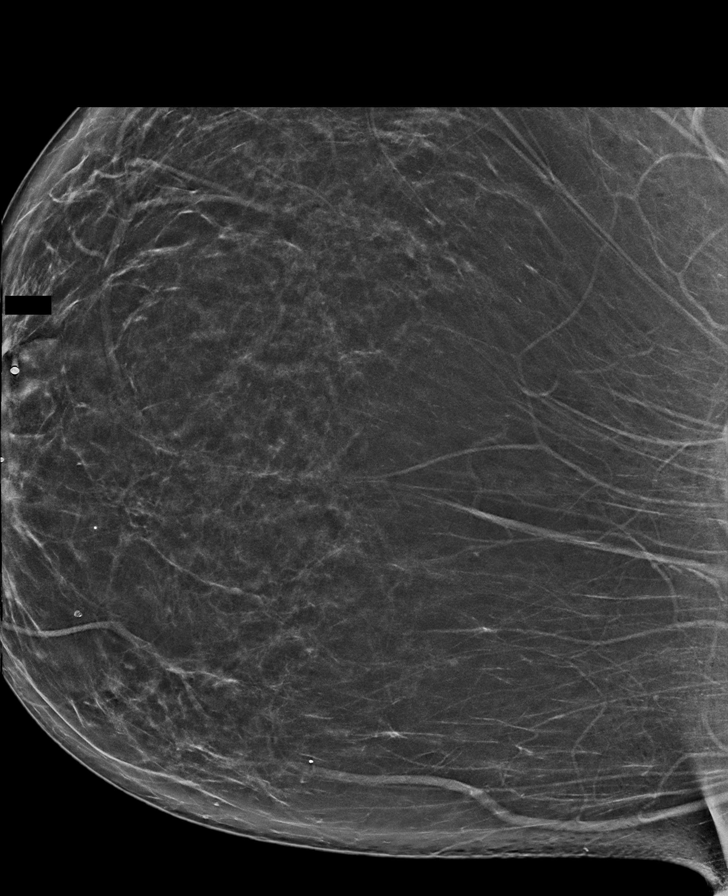

[L CC synth-2D]
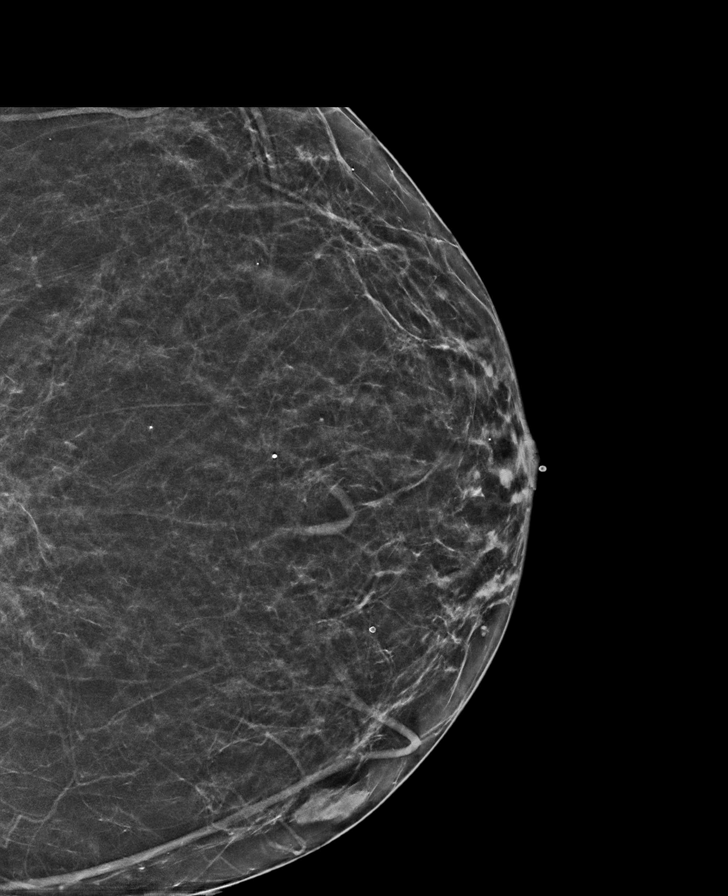

[R MLO synth-2D]
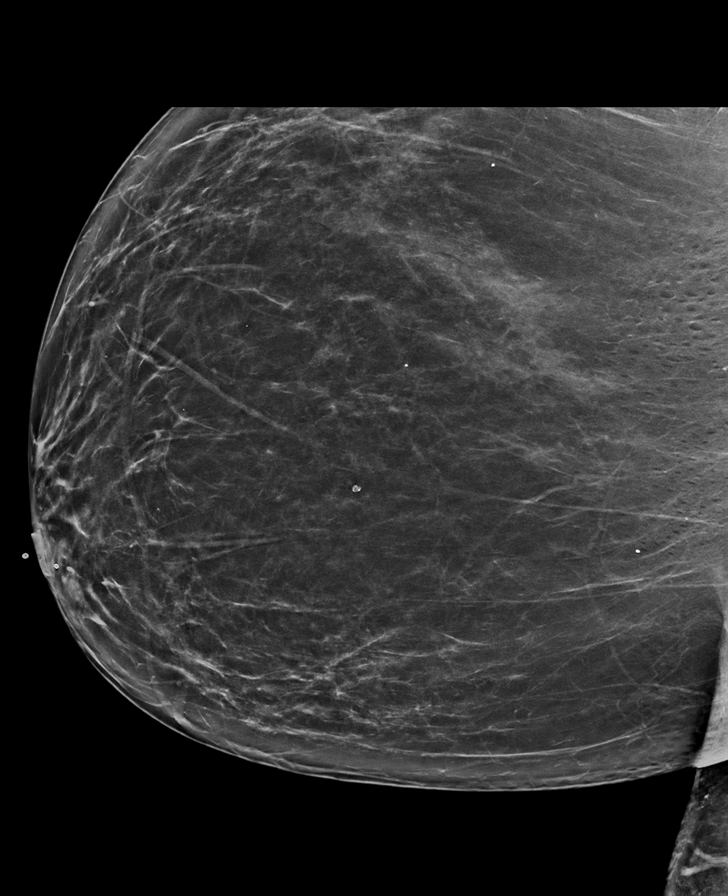

[L CC tomo · tomo slice 34/67.0]
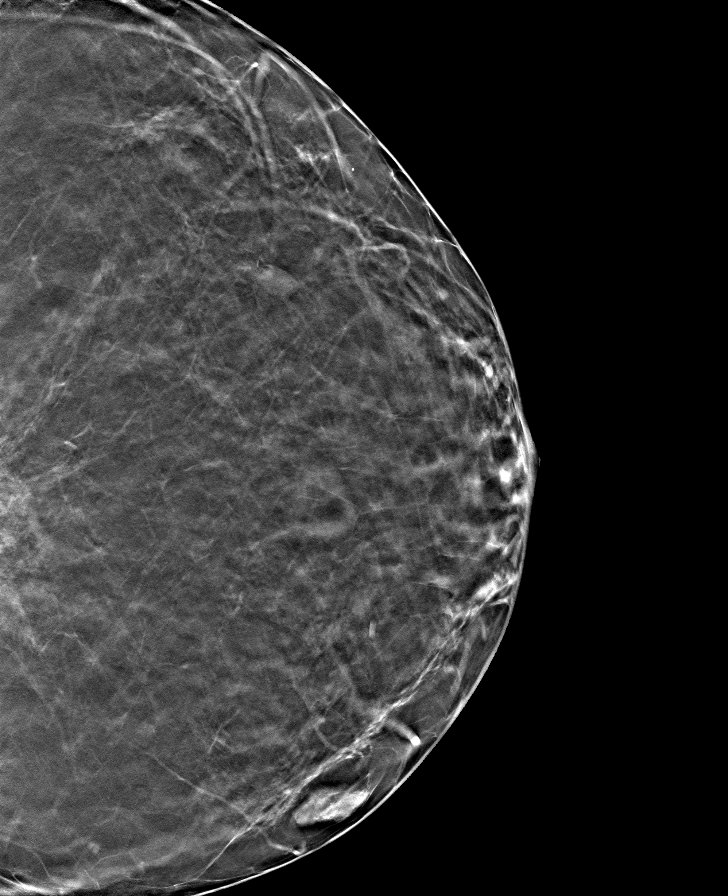

[8 of 40 positions shown; findings below may reference images not displayed]

ACR Breast Density Category b: There are scattered areas of
fibroglandular density.
FINDINGS: There are no findings suspicious for malignancy.
IMPRESSION: No mammographic evidence of malignancy. A result letter of this
screening mammogram will be mailed directly to the patient.

RECOMMENDATION:
Screening mammogram in one year. (Code:XG-X-X7B)

BI-RADS CATEGORY  1: Negative.

## 2023-08-29 MED ORDER — ZEPBOUND 15 MG/0.5ML ~~LOC~~ SOAJ
15.0000 mg | SUBCUTANEOUS | 5 refills | Status: AC
  Filled 2023-08-29: qty 2, 28d supply, fill #0
  Filled 2023-10-04: qty 2, 28d supply, fill #1

## 2023-10-03 ENCOUNTER — Encounter (HOSPITAL_BASED_OUTPATIENT_CLINIC_OR_DEPARTMENT_OTHER): Admitting: Cardiovascular Disease

## 2023-10-04 ENCOUNTER — Other Ambulatory Visit: Payer: Self-pay

## 2023-10-30 ENCOUNTER — Encounter (HOSPITAL_COMMUNITY): Payer: Self-pay

## 2023-11-01 ENCOUNTER — Encounter (HOSPITAL_COMMUNITY): Payer: Self-pay

## 2023-11-01 ENCOUNTER — Ambulatory Visit (HOSPITAL_COMMUNITY)
Admission: RE | Admit: 2023-11-01 | Discharge: 2023-11-01 | Disposition: A | Discharge status: Home / Self Care | Source: Ambulatory Visit | Attending: Cardiovascular Disease | Admitting: Cardiovascular Disease

## 2023-11-01 DIAGNOSIS — I421 Obstructive hypertrophic cardiomyopathy: Secondary | ICD-10-CM

## 2023-12-27 ENCOUNTER — Other Ambulatory Visit (HOSPITAL_BASED_OUTPATIENT_CLINIC_OR_DEPARTMENT_OTHER): Payer: Self-pay | Admitting: Cardiovascular Disease

## 2023-12-27 ENCOUNTER — Ambulatory Visit (HOSPITAL_COMMUNITY)
Admission: RE | Admit: 2023-12-27 | Discharge: 2023-12-27 | Disposition: A | Discharge status: Home / Self Care | Source: Ambulatory Visit | Attending: Cardiovascular Disease | Admitting: Cardiovascular Disease

## 2023-12-27 DIAGNOSIS — I421 Obstructive hypertrophic cardiomyopathy: Secondary | ICD-10-CM | POA: Insufficient documentation

## 2023-12-27 MED ORDER — GADOBUTROL 1 MMOL/ML IV SOLN
10.0000 mL | Freq: Once | INTRAVENOUS | Status: AC | PRN
  Administered 2023-12-27: 10 mL via INTRAVENOUS

## 2024-01-09 ENCOUNTER — Encounter (HOSPITAL_BASED_OUTPATIENT_CLINIC_OR_DEPARTMENT_OTHER): Payer: Self-pay

## 2024-01-11 ENCOUNTER — Other Ambulatory Visit (HOSPITAL_COMMUNITY): Payer: Self-pay

## 2024-01-11 ENCOUNTER — Telehealth: Payer: Self-pay | Admitting: Pharmacy Technician

## 2024-01-11 ENCOUNTER — Ambulatory Visit (INDEPENDENT_AMBULATORY_CARE_PROVIDER_SITE_OTHER): Admitting: Cardiovascular Disease

## 2024-01-11 ENCOUNTER — Encounter (HOSPITAL_BASED_OUTPATIENT_CLINIC_OR_DEPARTMENT_OTHER): Payer: Self-pay | Admitting: Cardiovascular Disease

## 2024-01-11 VITALS — BP 142/88 | HR 87 | Ht 61.0 in | Wt 317.6 lb

## 2024-01-11 DIAGNOSIS — I421 Obstructive hypertrophic cardiomyopathy: Secondary | ICD-10-CM

## 2024-01-11 DIAGNOSIS — E7800 Pure hypercholesterolemia, unspecified: Secondary | ICD-10-CM

## 2024-01-11 DIAGNOSIS — R002 Palpitations: Secondary | ICD-10-CM

## 2024-01-11 DIAGNOSIS — I1A0 Resistant hypertension: Secondary | ICD-10-CM

## 2024-01-11 LAB — LIPID PANEL
Chol/HDL Ratio: 3.6 ratio (ref 0.0–4.4)
Cholesterol, Total: 206 mg/dL — ABNORMAL HIGH (ref 100–199)
HDL: 57 mg/dL (ref >39)
LDL Chol Calc (NIH): 131 mg/dL — ABNORMAL HIGH (ref 0–99)
Triglycerides: 98 mg/dL (ref 0–149)
VLDL Cholesterol Cal: 18 mg/dL (ref 5–40)

## 2024-01-11 LAB — COMPREHENSIVE METABOLIC PANEL WITH GFR
ALT: 14 IU/L (ref 0–32)
AST: 17 IU/L (ref 0–40)
Albumin: 4.2 g/dL (ref 3.9–4.9)
Alkaline Phosphatase: 54 IU/L (ref 41–116)
BUN/Creatinine Ratio: 11 (ref 9–23)
BUN: 10 mg/dL (ref 6–24)
Bilirubin Total: 0.4 mg/dL (ref 0.0–1.2)
CO2: 22 mmol/L (ref 20–29)
Calcium: 9.9 mg/dL (ref 8.7–10.2)
Chloride: 101 mmol/L (ref 96–106)
Creatinine, Ser: 0.92 mg/dL (ref 0.57–1.00)
Globulin, Total: 3.2 g/dL (ref 1.5–4.5)
Glucose: 79 mg/dL (ref 70–99)
Potassium: 4.5 mmol/L (ref 3.5–5.2)
Sodium: 138 mmol/L (ref 134–144)
Total Protein: 7.4 g/dL (ref 6.0–8.5)
eGFR: 79 mL/min/1.73 (ref >59)

## 2024-01-11 MED ORDER — OLMESARTAN MEDOXOMIL-HCTZ 40-25 MG PO TABS: 1.0000 | ORAL_TABLET | Freq: Every day | ORAL | 3 refills | Status: DC

## 2024-01-11 MED ORDER — ATORVASTATIN CALCIUM 20 MG PO TABS: 20.0000 mg | ORAL_TABLET | Freq: Every day | ORAL | 2 refills | Status: DC

## 2024-01-11 MED ORDER — AMLODIPINE BESYLATE 10 MG PO TABS: 10.0000 mg | ORAL_TABLET | Freq: Every day | ORAL | 3 refills | Status: DC

## 2024-01-11 MED ORDER — SPIRONOLACTONE 25 MG PO TABS: 25.0000 mg | ORAL_TABLET | Freq: Every day | ORAL | 3 refills | Status: DC

## 2024-01-11 MED ORDER — METOPROLOL TARTRATE 25 MG PO TABS: 25.0000 mg | ORAL_TABLET | Freq: Two times a day (BID) | ORAL | 1 refills | Status: DC

## 2024-01-11 NOTE — Progress Notes (Signed)
 40/25 mg daily.  [facility] Initial Assessment:    Date:  01/11/2024   ID:  Jamie Livingston, DOB 14-May-1980, MRN [id number]  PCP:  Alben Therisa MATSU, PA  Cardiologist:  None   Referring MD: Alben Therisa MATSU, PA   CC: Hypertension  History of Present Illness:    Jamie Livingston is a 43 y.o. female with a hx of hypertension, and hyperlipidemia here for follow up.  She first established care in the [facility] 02/2022.  She saw her PCP 01/2022 and blood pressure was uncontrolled on metoprolol, HCTZ, valsartan, and amlodipine .  She was diagnosed with hypertension around age 56.  At the time, she was on the way home from her son's graduation. She suddenly became really lightheaded, hot, and nauseous while in the car. She leaned out to vomit, and the next thing she knew she was on the ground after passing out and her daughter was telling her she was bleeding. EMS found her blood pressure to be 200+ systolic, but could not determine a diastolic reading. She deferred EMS transport to the hospital. She later went to the [facility] and was transferred to [facility] hospital. In clinic today, her blood pressure is initially 152/95, which she states is similar to her home readings. She has seen blood pressures as high as the 170s/85-90. At home she uses a wrist cuff.   Jamie Livingston was [facility] to [facility].  Her medication was simplified to Tribenzor and spironolactone  was added.  CT-A revealed no adrenal adenomas and renal arteries were patent.  She followed up with our pharmacist and was started on Zepbound .  At her visit 06/2023 she noted palpitations that started after taking Zepbound .  She was exercising regularly and had no exertional symptoms.  She noted lower extremity edema.  Echo 07/2023 revealed LVEF 69% with hypertrophic obstructive cardiomyopathy.  LVOT gradient was 57 mmHg.  She had severe asymmetric LVH and grade 1 diastolic dysfunction.  There is  also systolic anterior motion of the mitral valve.  Cardiac MRI 12/27/2023 consistent with hypertrophic cardiomyopathy.  She had severe severe basal septal thickening with 2.1 cm relative to 1 cm of the posterior wall. There was mild DE.  She was previously on metoprolol but this was discontinued for improved blood pressure control.  Discussed the use of AI scribe software for clinical note transcription with the patient, who gave verbal consent to proceed.  History of Present Illness Jamie Livingston has been monitoring her blood pressure at home, which has been around 130/82 mmHg. However, she noted an elevated reading of 152 mmHg in the office today, attributing it to anxiety about her MRI results. She feels physically well with no shortness of breath but experiences occasional heart flutters. Her energy levels have improved significantly since starting Zepbound , allowing her to engage in more activities such as walking with her grandson and dog, and visiting the zoo and carnival.  She is aware of a family history of heart disease, with her great grandmother having died of a heart attack and her younger brother being on heart medication, though she is unsure of his specific diagnosis. No family history of sudden death is reported.  She was previously on metoprolol, which she believes helped with her symptoms, and she still has some medication left. She takes amlodipine  in the evening and manages her medications on a monthly basis. She was on Zepbound  15 mg, which she feels was beneficial, but she is currently out of  it and awaiting the outcome of an appeal for continued use.  Previous antihypertensives:   Past Medical History:  Diagnosis Date   HTN (hypertension)    Hyperlipidemia    Morbid obesity (HCC) 03/16/2022   Pure hypercholesterolemia 03/16/2022    Past Surgical History:  Procedure Laterality Date   CESAREAN SECTION  2006    Current Medications: Current Meds  Medication Sig    albuterol (VENTOLIN HFA) 108 (90 Base) MCG/ACT inhaler Inhale 2 puffs into the lungs as needed.   amLODipine  (NORVASC ) 10 MG tablet TAKE 1 TABLET BY MOUTH DAILY   atorvastatin (LIPITOR) 20 MG tablet Take 20 mg by mouth daily.   FEROSUL 325 (65 Fe) MG tablet Take 325 mg by mouth daily.   fluticasone  (FLONASE ) 50 MCG/ACT nasal spray Place 2 sprays into both nostrils daily.   loratadine  (CLARITIN ) 10 MG tablet Take 1 tablet (10 mg total) by mouth daily.   olmesartan -hydrochlorothiazide (BENICAR  HCT) 40-25 MG tablet TAKE 1 TABLET BY MOUTH DAILY   spironolactone  (ALDACTONE ) 25 MG tablet TAKE 1 TABLET(25 MG) BY MOUTH DAILY   tranexamic acid (LYSTEDA) 650 MG TABS tablet Take 1 tablet by mouth as needed.     Allergies:   Patient has no known allergies.   Social History   Socioeconomic History   Marital status: Single    Spouse name: Not on file   Number of children: Not on file   Years of education: Not on file   Highest education level: Not on file  Occupational History   Not on file  Tobacco Use   Smoking status: Never   Smokeless tobacco: Never  Substance and Sexual Activity   Alcohol use: Never   Drug use: Never   Sexual activity: Not on file  Other Topics Concern   Not on file  Social History Narrative   Not on file   Social Drivers of Health   Financial Resource Strain: Not on file  Food Insecurity: No Food Insecurity (03/16/2022)   Hunger Vital Sign    Worried About Running Out of Food in the Last Year: Never true    Ran Out of Food in the Last Year: Never true  Transportation Needs: No Transportation Needs (03/16/2022)   PRAPARE - Administrator, Civil Service (Medical): No    Lack of Transportation (Non-Medical): No  Physical Activity: Inactive (03/16/2022)   Exercise Vital Sign    Days of Exercise per Week: 0 days    Minutes of Exercise per Session: 0 min  Stress: Not on file  Social Connections: Not on file     Family History: The patient's family  history includes Heart disease in her mother; Heart failure in her maternal grandmother; Hyperlipidemia in her maternal grandfather and mother; Hypertension in her maternal grandfather, maternal grandmother, and mother.  ROS:   Please see the history of present illness.    (+) Left knee stiffness and edema (+) Persistent cough (+) Sinus drainage (+) Snoring All other systems reviewed and are negative.  EKGs/Labs/Other Studies Reviewed:    Echo 08/16/23:  1. The basal septum measures 1.8 cm (image 47). Findings consistent with  HOCM - rest LVOT gradient up to 57 mmHg. Valsalva was not performed.. Left  ventricular ejection fraction, by estimation, is 65 to 70%. Left  ventricular ejection fraction by PLAX is   69 %. The left ventricle has normal function. The left ventricle has no  regional wall motion abnormalities. There is severe asymmetric left  ventricular hypertrophy  of the basal-septal segment. Left ventricular  diastolic parameters are consistent with  Grade I diastolic dysfunction (impaired relaxation).   2. Right ventricular systolic function is normal. The right ventricular  size is normal. Tricuspid regurgitation signal is inadequate for assessing  PA pressure.   3. SAM noted. The mitral valve is grossly normal. Trivial mitral valve  regurgitation.   4. The aortic valve is calcified. Aortic valve regurgitation is not  visualized. No aortic stenosis is present.   5. The inferior vena cava is normal in size with greater than 50%  respiratory variability, suggesting right atrial pressure of 3 mmHg.   Chest X-ray  12/31/2021: FINDINGS: Frontal and lateral views of the chest demonstrate an unremarkable cardiac silhouette. No acute airspace disease, effusion, or pneumothorax. No acute bony abnormality.   IMPRESSION: 1. No acute intrathoracic process.  Cardiac MRI 12/27/23: IMPRESSION: 1. Findings consistent with hypertrophic cardiomyopathy Small LV cavity with  hyperdynamic function LVEF 69%. Severe basal septal thickening 22 mm relative to posterior wall of 10 mm. SAM present with turbulence in LVOT Suggest echo correlation for resting gradients. No significant MR present. Delayed enhancement images with mild uptake in the mid basal septum These areas have elevated T1 and ECV also suggesting myofibrillar disarray   2.  Normal RV size and function RVEF 62%   3.  Mild LAE   4.  No significant MR   5.  Normal T2  EKG:  EKG is personally reviewed. 03/16/2022: Sinus rhythm. Rate 76 bpm. LVH. Secondary repolarization abnormality.  Recent Labs: 07/26/2023: ALT 27; BNP 35.6; BUN 13; Creatinine, Ser 1.09; Hemoglobin 12.2; Magnesium 1.9; Platelets 396; Potassium 4.2; Sodium 141; TSH 2.990   Recent Lipid Panel No results found for: CHOL, TRIG, HDL, CHOLHDL, VLDL, LDLCALC, LDLDIRECT  Physical Exam:    VS:  BP (!) 158/92   Pulse 87   Ht 5' 1 (1.549 m)   Wt (!) 317 lb 9.6 oz (144.1 kg)   SpO2 94% Comment: RA  BMI 60.01 kg/m  , BMI Body mass index is 60.01 kg/m. GENERAL:  Well appearing HEENT: Pupils equal round and reactive, fundi not visualized, oral mucosa unremarkable NECK:  No jugular venous distention, waveform within normal limits, carotid upstroke brisk and symmetric, no bruits, no thyromegaly LUNGS:  Clear to auscultation bilaterally HEART:  RRR.  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 Minimal scarring on MRI, low risk for sudden cardiac death. Discussed genetic predisposition and symptom monitoring. Metoprolol recommended. Referral to specialist advised. - Restart metoprolol 25 mg twice daily. - Refer to Dr. Arne Colt for specialized management. - Order 7-day heart monitor for arrhythmias. - Discuss potential future use of Camzyos if symptoms worsen. - Recommend echocardiogram screening for children.  # Palpitations Intermittent palpitations likely related to hypertrophic cardiomyopathy. Metoprolol expected to reduce palpitations. - Use 7-day heart monitor for arrhythmias. -  Restart metoprolol.  # Essential hypertension Blood pressure elevated in office, likely due to anxiety. Home readings generally controlled. Metoprolol may aid in blood pressure control. - Continue current antihypertensive regimen including amlodipine . - Monitor blood pressure at home. - Add metoprolol 25 mg twice daily.  # Obesity Previous Zepbound  use beneficial. Current insurance issues with Zepbound , exploring Ozempic as alternative. Discussed cost-saving strategies. - Investigate Zepbound  sample availability. - Consider Ozempic if Zepbound  not approved.  # Morbid obesity: Continue Zepbound , diet and exercise.    Screening for Secondary Hypertension:     03/16/2022    2:43 PM  Causes  Drugs/Herbals Screened     - Comments tries to limit sodium and eats at home. Rare caffeine or EtOH.  Sleep Apnea Screened     - Comments snores.  No daytime somnolence or morning sleepiness.  Thyroid  Disease Screened     - Comments TSH wnl  Hyperaldosteronism Screened     - Comments check renin and aldosterone  Pheochromocytoma N/A  Cushing's Syndrome Screened     - Comments check cortisol  Hyperparathyroidism Screened  Coarctation of  the Aorta Screened     - Comments BP symmetric  Compliance Screened    Relevant Labs/Studies:    Latest Ref Rng & Units 07/26/2023    8:27 AM 10/12/2022    1:07 PM 03/18/2022    8:20 AM  Basic Labs  Sodium 134 - 144 mmol/L 141  144  141   Potassium 3.5 - 5.2 mmol/L 4.2  4.8  3.6   Creatinine 0.57 - 1.00 mg/dL 8.90  8.75  9.17        Latest Ref Rng & Units 07/26/2023    8:27 AM 12/31/2021   10:23 PM  Thyroid    TSH 0.450 - 4.500 uIU/mL 2.990  4.539      Disposition:    FU with Yusif Gnau C. Raford, MD, [facility] in 4 months.  Medication Adjustments/Labs and Tests Ordered: Current medicines are reviewed at length with the patient today.  Concerns regarding medicines are outlined above.   No orders of the defined types were placed in this encounter.  No orders of the defined types were placed in this encounter.    Signed, Annabella Raford, MD  01/11/2024 3:57 PM    [facility]

## 2024-01-11 NOTE — Telephone Encounter (Signed)
    Jamie Livingston is also not covered plan/benefit excluded.   Doesn't look like she has diabetes so wouldn't be able to do ozempic or mounjaro .

## 2024-01-11 NOTE — Patient Instructions (Signed)
 Medication Instructions:   START TAKING METOPROLOL TARTRATE (LOPRESSOR) 25 MG BY MOUTH TWICE DAILY     Follow-Up: Please follow up in _4_ months in [facility] with Dr. Raford, Reche Finder, NP or Allean Mink PharmD    Special Instructions:   We will send a message to our Prior Authorization team to see if they can look into Zepbound  coverage through your insurance carrier

## 2024-01-12 ENCOUNTER — Other Ambulatory Visit (HOSPITAL_COMMUNITY): Payer: Self-pay

## 2024-01-17 ENCOUNTER — Ambulatory Visit: Payer: Self-pay | Admitting: Cardiovascular Disease

## 2024-01-30 ENCOUNTER — Encounter (HOSPITAL_BASED_OUTPATIENT_CLINIC_OR_DEPARTMENT_OTHER): Payer: Self-pay | Admitting: Cardiovascular Disease

## 2024-01-30 DIAGNOSIS — R002 Palpitations: Secondary | ICD-10-CM | POA: Insufficient documentation

## 2024-01-30 DIAGNOSIS — I421 Obstructive hypertrophic cardiomyopathy: Secondary | ICD-10-CM | POA: Insufficient documentation

## 2024-02-02 ENCOUNTER — Ambulatory Visit: Payer: Self-pay | Admitting: Cardiovascular Disease

## 2024-02-21 ENCOUNTER — Other Ambulatory Visit (HOSPITAL_BASED_OUTPATIENT_CLINIC_OR_DEPARTMENT_OTHER): Payer: Self-pay | Admitting: *Deleted

## 2024-02-21 ENCOUNTER — Encounter (HOSPITAL_BASED_OUTPATIENT_CLINIC_OR_DEPARTMENT_OTHER): Payer: Self-pay | Admitting: Cardiovascular Disease

## 2024-02-21 MED ORDER — ATORVASTATIN CALCIUM 20 MG PO TABS: 20.0000 mg | ORAL_TABLET | Freq: Every day | ORAL | 3 refills | Status: AC

## 2024-02-21 MED ORDER — SPIRONOLACTONE 25 MG PO TABS: 25.0000 mg | ORAL_TABLET | Freq: Every day | ORAL | 3 refills | Status: DC

## 2024-02-21 MED ORDER — AMLODIPINE BESYLATE 10 MG PO TABS: 10.0000 mg | ORAL_TABLET | Freq: Every day | ORAL | 3 refills | Status: DC

## 2024-02-21 MED ORDER — OLMESARTAN MEDOXOMIL-HCTZ 40-25 MG PO TABS: 1.0000 | ORAL_TABLET | Freq: Every day | ORAL | 3 refills | Status: AC

## 2024-03-29 ENCOUNTER — Other Ambulatory Visit: Payer: Self-pay

## 2024-03-29 ENCOUNTER — Encounter (HOSPITAL_BASED_OUTPATIENT_CLINIC_OR_DEPARTMENT_OTHER): Payer: Self-pay | Admitting: Cardiovascular Disease

## 2024-03-29 MED ORDER — METOPROLOL TARTRATE 25 MG PO TABS: 25.0000 mg | ORAL_TABLET | Freq: Two times a day (BID) | ORAL | 1 refills | Status: AC

## 2024-03-29 MED ORDER — AMLODIPINE BESYLATE 10 MG PO TABS: 10.0000 mg | ORAL_TABLET | Freq: Every day | ORAL | 3 refills | Status: AC

## 2024-03-29 MED ORDER — SPIRONOLACTONE 25 MG PO TABS: 25.0000 mg | ORAL_TABLET | Freq: Every day | ORAL | 3 refills | Status: AC

## 2024-05-17 ENCOUNTER — Encounter (HOSPITAL_BASED_OUTPATIENT_CLINIC_OR_DEPARTMENT_OTHER): Admitting: Cardiovascular Disease

## 2024-05-30 ENCOUNTER — Ambulatory Visit: Admitting: Internal Medicine
